# Patient Record
Sex: Female | Born: 1965 | Race: White | Hispanic: No | Marital: Single | Smoking: Former smoker
Health system: Southern US, Community
[De-identification: ages and names within clinical notes are randomized; demographics above are authoritative.]

## PROBLEM LIST (undated history)

## (undated) DIAGNOSIS — Z720 Tobacco use: Secondary | ICD-10-CM

## (undated) DIAGNOSIS — R911 Solitary pulmonary nodule: Secondary | ICD-10-CM

## (undated) DIAGNOSIS — C801 Malignant (primary) neoplasm, unspecified: Secondary | ICD-10-CM

## (undated) DIAGNOSIS — I10 Essential (primary) hypertension: Secondary | ICD-10-CM

## (undated) DIAGNOSIS — E785 Hyperlipidemia, unspecified: Secondary | ICD-10-CM

## (undated) DIAGNOSIS — D3A090 Benign carcinoid tumor of the bronchus and lung: Secondary | ICD-10-CM

## (undated) DIAGNOSIS — K219 Gastro-esophageal reflux disease without esophagitis: Secondary | ICD-10-CM

## (undated) DIAGNOSIS — E039 Hypothyroidism, unspecified: Secondary | ICD-10-CM

## (undated) HISTORY — DX: Essential (primary) hypertension: I10

## (undated) HISTORY — DX: Tobacco use: Z72.0

## (undated) HISTORY — DX: Solitary pulmonary nodule: R91.1

## (undated) HISTORY — PX: BONE RESECTION: SHX897

## (undated) HISTORY — DX: Gastro-esophageal reflux disease without esophagitis: K21.9

## (undated) HISTORY — DX: Benign carcinoid tumor of the bronchus and lung: D3A.090

## (undated) HISTORY — DX: Hyperlipidemia, unspecified: E78.5

## (undated) HISTORY — PX: TUBAL LIGATION: SHX77

---

## 2009-05-20 ENCOUNTER — Ambulatory Visit (HOSPITAL_COMMUNITY): Admission: RE | Admit: 2009-05-20 | Discharge: 2009-05-20 | Payer: Self-pay | Admitting: Family Medicine

## 2009-05-27 ENCOUNTER — Ambulatory Visit (HOSPITAL_COMMUNITY): Admission: RE | Admit: 2009-05-27 | Discharge: 2009-05-27 | Payer: Self-pay | Admitting: Family Medicine

## 2009-06-15 ENCOUNTER — Ambulatory Visit: Payer: Self-pay | Admitting: Thoracic Surgery

## 2009-06-28 ENCOUNTER — Encounter: Payer: Self-pay | Admitting: Thoracic Surgery

## 2009-06-28 ENCOUNTER — Inpatient Hospital Stay (HOSPITAL_COMMUNITY): Admission: RE | Admit: 2009-06-28 | Discharge: 2009-07-03 | Payer: Self-pay | Admitting: Thoracic Surgery

## 2009-06-28 ENCOUNTER — Ambulatory Visit: Payer: Self-pay | Admitting: Thoracic Surgery

## 2009-06-28 HISTORY — PX: OTHER SURGICAL HISTORY: SHX169

## 2009-07-09 ENCOUNTER — Ambulatory Visit: Payer: Self-pay | Admitting: Thoracic Surgery

## 2009-07-09 ENCOUNTER — Encounter: Admission: RE | Admit: 2009-07-09 | Discharge: 2009-07-09 | Payer: Self-pay | Admitting: Thoracic Surgery

## 2009-07-23 ENCOUNTER — Encounter: Admission: RE | Admit: 2009-07-23 | Discharge: 2009-07-23 | Payer: Self-pay | Admitting: Thoracic Surgery

## 2009-07-23 ENCOUNTER — Ambulatory Visit: Payer: Self-pay | Admitting: Thoracic Surgery

## 2009-09-03 ENCOUNTER — Ambulatory Visit: Payer: Self-pay | Admitting: Thoracic Surgery

## 2009-09-03 ENCOUNTER — Encounter: Admission: RE | Admit: 2009-09-03 | Discharge: 2009-09-03 | Payer: Self-pay | Admitting: Thoracic Surgery

## 2009-11-09 ENCOUNTER — Ambulatory Visit: Payer: Self-pay | Admitting: Thoracic Surgery

## 2009-11-09 ENCOUNTER — Encounter: Admission: RE | Admit: 2009-11-09 | Discharge: 2009-11-09 | Payer: Self-pay | Admitting: Thoracic Surgery

## 2010-05-25 ENCOUNTER — Encounter
Admission: RE | Admit: 2010-05-25 | Discharge: 2010-05-25 | Payer: Self-pay | Source: Home / Self Care | Attending: Thoracic Surgery | Admitting: Thoracic Surgery

## 2010-05-25 ENCOUNTER — Ambulatory Visit
Admission: RE | Admit: 2010-05-25 | Discharge: 2010-05-25 | Payer: Self-pay | Source: Home / Self Care | Attending: Thoracic Surgery | Admitting: Thoracic Surgery

## 2010-05-29 ENCOUNTER — Encounter: Payer: Self-pay | Admitting: Thoracic Surgery

## 2010-05-29 ENCOUNTER — Encounter: Payer: Self-pay | Admitting: Family Medicine

## 2010-07-24 LAB — PROTIME-INR
INR: 0.89 (ref 0.00–1.49)
Prothrombin Time: 12 seconds (ref 11.6–15.2)

## 2010-07-24 LAB — CBC
MCHC: 33.3 g/dL (ref 30.0–36.0)
Platelets: 384 10*3/uL (ref 150–400)
RBC: 4.65 MIL/uL (ref 3.87–5.11)
WBC: 15 10*3/uL — ABNORMAL HIGH (ref 4.0–10.5)

## 2010-07-25 LAB — GLUCOSE, CAPILLARY: Glucose-Capillary: 129 mg/dL — ABNORMAL HIGH (ref 70–99)

## 2010-07-28 LAB — TYPE AND SCREEN
ABO/RH(D): O POS
Antibody Screen: NEGATIVE

## 2010-07-28 LAB — URINE CULTURE: Colony Count: NO GROWTH

## 2010-07-28 LAB — URINALYSIS, ROUTINE W REFLEX MICROSCOPIC
Glucose, UA: >1000 mg/dL — AB
Hgb urine dipstick: NEGATIVE
Ketones, ur: NEGATIVE mg/dL
Protein, ur: NEGATIVE mg/dL
pH: 6 (ref 5.0–8.0)

## 2010-07-28 LAB — GLUCOSE, CAPILLARY
Glucose-Capillary: 105 mg/dL — ABNORMAL HIGH (ref 70–99)
Glucose-Capillary: 112 mg/dL — ABNORMAL HIGH (ref 70–99)
Glucose-Capillary: 112 mg/dL — ABNORMAL HIGH (ref 70–99)
Glucose-Capillary: 122 mg/dL — ABNORMAL HIGH (ref 70–99)
Glucose-Capillary: 124 mg/dL — ABNORMAL HIGH (ref 70–99)
Glucose-Capillary: 139 mg/dL — ABNORMAL HIGH (ref 70–99)
Glucose-Capillary: 140 mg/dL — ABNORMAL HIGH (ref 70–99)
Glucose-Capillary: 163 mg/dL — ABNORMAL HIGH (ref 70–99)
Glucose-Capillary: 187 mg/dL — ABNORMAL HIGH (ref 70–99)
Glucose-Capillary: 223 mg/dL — ABNORMAL HIGH (ref 70–99)
Glucose-Capillary: 77 mg/dL (ref 70–99)
Glucose-Capillary: 88 mg/dL (ref 70–99)

## 2010-07-28 LAB — CBC
HCT: 38.3 % (ref 36.0–46.0)
HCT: 41 % (ref 36.0–46.0)
HCT: 41 % (ref 36.0–46.0)
HCT: 47.2 % — ABNORMAL HIGH (ref 36.0–46.0)
Hemoglobin: 12.8 g/dL (ref 12.0–15.0)
MCHC: 33.2 g/dL (ref 30.0–36.0)
MCHC: 33.3 g/dL (ref 30.0–36.0)
MCHC: 33.9 g/dL (ref 30.0–36.0)
MCV: 96.8 fL (ref 78.0–100.0)
MCV: 97.6 fL (ref 78.0–100.0)
MCV: 97.9 fL (ref 78.0–100.0)
MCV: 98.5 fL (ref 78.0–100.0)
Platelets: 299 10*3/uL (ref 150–400)
Platelets: 316 10*3/uL (ref 150–400)
Platelets: 320 10*3/uL (ref 150–400)
Platelets: 381 10*3/uL (ref 150–400)
RDW: 16.4 % — ABNORMAL HIGH (ref 11.5–15.5)
RDW: 16.7 % — ABNORMAL HIGH (ref 11.5–15.5)
RDW: 16.8 % — ABNORMAL HIGH (ref 11.5–15.5)
RDW: 17 % — ABNORMAL HIGH (ref 11.5–15.5)
WBC: 14.5 10*3/uL — ABNORMAL HIGH (ref 4.0–10.5)
WBC: 20.3 10*3/uL — ABNORMAL HIGH (ref 4.0–10.5)

## 2010-07-28 LAB — COMPREHENSIVE METABOLIC PANEL
ALT: 40 U/L — ABNORMAL HIGH (ref 0–35)
AST: 22 U/L (ref 0–37)
AST: 32 U/L (ref 0–37)
Albumin: 2.5 g/dL — ABNORMAL LOW (ref 3.5–5.2)
Alkaline Phosphatase: 70 U/L (ref 39–117)
CO2: 27 mEq/L (ref 19–32)
Calcium: 8.2 mg/dL — ABNORMAL LOW (ref 8.4–10.5)
Creatinine, Ser: 0.81 mg/dL (ref 0.4–1.2)
GFR calc Af Amer: >60 mL/min (ref >60)
GFR calc non Af Amer: >60 mL/min (ref >60)
GFR calc non Af Amer: >60 mL/min (ref >60)
Glucose, Bld: 57 mg/dL — ABNORMAL LOW (ref 70–99)
Potassium: 3.7 mEq/L (ref 3.5–5.1)
Sodium: 139 mEq/L (ref 135–145)
Total Protein: 5.5 g/dL — ABNORMAL LOW (ref 6.0–8.3)

## 2010-07-28 LAB — POCT I-STAT 3, ART BLOOD GAS (G3+)
Acid-Base Excess: 4 mmol/L — ABNORMAL HIGH (ref 0.0–2.0)
Acid-Base Excess: 5 mmol/L — ABNORMAL HIGH (ref 0.0–2.0)
Bicarbonate: 24.7 mEq/L — ABNORMAL HIGH (ref 20.0–24.0)
Bicarbonate: 32 mEq/L — ABNORMAL HIGH (ref 20.0–24.0)
O2 Saturation: 100 %
O2 Saturation: 95 %
Patient temperature: 98.8
Patient temperature: 99.2
TCO2: 25 mmol/L (ref 0–100)
pCO2 arterial: 22.8 mmHg — ABNORMAL LOW (ref 35.0–45.0)
pH, Arterial: 7.642 (ref 7.350–7.400)
pO2, Arterial: 145 mmHg — ABNORMAL HIGH (ref 80.0–100.0)
pO2, Arterial: 84 mmHg (ref 80.0–100.0)

## 2010-07-28 LAB — PROTIME-INR: Prothrombin Time: 11.3 seconds — ABNORMAL LOW (ref 11.6–15.2)

## 2010-07-28 LAB — BASIC METABOLIC PANEL
BUN: 15 mg/dL (ref 6–23)
CO2: 33 mEq/L — ABNORMAL HIGH (ref 19–32)
Chloride: 101 mEq/L (ref 96–112)
Chloride: 92 mEq/L — ABNORMAL LOW (ref 96–112)
Creatinine, Ser: 0.83 mg/dL (ref 0.4–1.2)
GFR calc non Af Amer: >60 mL/min (ref >60)
Glucose, Bld: 153 mg/dL — ABNORMAL HIGH (ref 70–99)
Glucose, Bld: 161 mg/dL — ABNORMAL HIGH (ref 70–99)
Potassium: 3.8 mEq/L (ref 3.5–5.1)
Potassium: 3.9 mEq/L (ref 3.5–5.1)
Sodium: 132 mEq/L — ABNORMAL LOW (ref 135–145)

## 2010-07-28 LAB — BLOOD GAS, ARTERIAL
Acid-Base Excess: 3.5 mmol/L — ABNORMAL HIGH (ref 0.0–2.0)
Bicarbonate: 27.9 mEq/L — ABNORMAL HIGH (ref 20.0–24.0)
FIO2: 0.21 %
O2 Saturation: 97.8 %
Patient temperature: 98.6

## 2010-07-28 LAB — ABO/RH: ABO/RH(D): O POS

## 2010-07-28 LAB — URINE MICROSCOPIC-ADD ON

## 2010-07-28 LAB — MRSA PCR SCREENING: MRSA by PCR: NEGATIVE

## 2010-09-20 NOTE — Letter (Signed)
July 09, 2009   Forest Gleason, MD  [address]  [address]  [place], [place]  [address]   Re:  DONICE, ALPERIN              DOB:  [birth year]   Dear Dr. 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  There is no evidence of any metastatic disease in  lymph nodes.  Her incisions are healing well.  We removed her chest tube  sutures.  I told her to gradually increase her activities.  She is still  having a moderate amount of pain.  We gave her another prescription for  Percocet, #50.  I plan to see her back again in 3 weeks with a chest x-  ray.   Ines Bloomer, M.D.  Electronically Signed   DPB/MEDQ  D:  07/09/2009  T:  07/10/2009  Job:  [id number]

## 2010-09-20 NOTE — Letter (Signed)
June 15, 2009   Forest Gleason, MD  [address]  [address]  [place], [place]  [address]   Re:  Joanne [name], Joanne [name]              DOB:  [birth year]   Dear Dr. Shelva Majestic:   I saw the patient back today.  This 45 year old former smoker has had a  history of some nausea and vomiting and severe diarrhea, which she lost  about 30 pounds.  This has since resolved; but in her workup, a CT scan  was done of the abdomen, which showed a right lower lobe nodule and then  a CT of the chest, which showed multiple nodules in the right lung with  the largest being a 14-mm nodule in the right lower lobe.  She has 3  nodules in the right lung.  A needle biopsy was told to be attempted,  but that could not be done.  She had a PET scan, which had an SUV on the  right lower lobe 14-mm nodule of 3.  The other nodules were not  enlarged.  She is referred here for evaluation.  She has had no  hemoptysis, fever, chills, or excessive sputum.   PAST MEDICAL HISTORY:   MEDICATIONS:  1. Hydrochlorothiazide 25 mg a day.  2. Lisinopril 10 mg a day.  3. Metformin 1000 mg daily for diabetes mellitus type 2.  4. Lovastatin 40 mg at night for dyslipidemia and she also has      hypertension.   ALLERGIES:  She is allergic to penicillin.   FAMILY HISTORY:  Noncontributory.   SOCIAL HISTORY:  She worked with radiology.  She has 2 children.  She is  married.  She quit smoking 14 weeks ago.  Does not drink alcohol on a  regular basis.   REVIEW OF SYSTEMS:  VITAL SIGNS:  Her weight is 202 pounds.  She is 5  feet 3.  GENERAL:  She has now gained back her weight that she has lost.  CARDIAC:  No angina or atrial fibrillation.  PULMONARY:  No bronchitis or hemoptysis.  GI:  No nausea, vomiting, constipation, or diarrhea.  See history of  present illness.  GU:  No kidney disease.  VASCULAR:  No claudication, DVT, or TIAs.  NEUROLOGICAL:  No dizziness, headaches, blackouts, or seizures.  MUSCULOSKELETAL:  No arthritis or  joint pain.  PSYCHIATRIC:  No depression or nervousness.  EYE/ENT:  No change in her eyesight or hearing.  HEMATOLOGICAL:  No problems with bleeding, clotting disorders, or  anemia.   PHYSICAL EXAMINATION:  General:  She is an obese Caucasian female in no  acute distress.  Her blood pressure was 146/78, pulse 96, respirations  18, and sats were 98%.  Head, Eyes, Ears, Nose, and Throat:  Unremarkable.  Neck:  Supple without thyromegaly.  There is no  supraclavicular or axillary adenopathy.  Chest:  Clear to auscultation  and percussion.  Heart:  Regular sinus rhythm.  No murmurs.  Abdomen:  Soft.  There is no hepatosplenomegaly.  Extremities:  Pulses are 2+.  There is no clubbing or edema.  Neurological:  She is oriented x3.  Sensory and motor intact.  Cranial nerves intact.   I have discussed the situation with her, with the right lower lobe  nodule being borderline and positive on PET, I feel the best thing to do  is to excise this since there is a high probability, this may be a low-  grade  neuroendocrine tumor or carcinoid tumor where at the same time we  will excise the right middle lobe nodule, which is the next largest and  remove the nodules that are palpated on the right side.  She agrees to  this.  We plan to do this on June 28, 2008, at [facility].   Sincerely,   Ines Bloomer, M.D.  Electronically Signed   DPB/MEDQ  D:  06/15/2009  T:  06/16/2009  Job:  [id number]

## 2010-09-20 NOTE — Assessment & Plan Note (Signed)
OFFICE VISIT   Joanne [name], Joanne [name]  DOB:  [birth year]                                        July 23, 2009  CHART #:  [id number]   HISTORY:  The patient comes in today for 2-week followup.  She is status  post right lower lobe wedge resection on June 28, 2009.  She had  been doing well from a postsurgical standpoint.  She is still having a  little bit of discomfort, but overall is doing well.  Over the past 48  hours, though she has had a reoccurrence of nausea and vomiting with  some diarrhea.  She had had this previously and currently is taking  Phenergan and some things to help with diarrhea.  Her symptoms have been  stable since early this morning and she has had no vomiting since  earlier in the day, but she generally feels weak and tired.  She denies  any fevers, chills, or cough.  She has not spoken with her primary care  physician about her symptoms.   PHYSICAL EXAMINATION:  Vital Signs:  Blood pressure is 123/87, pulse is  118, respirations 18, O2 sat 97% on room air, and temperature 98.  Chest:  Her right thoracotomy incision as well as chest tube sites have  healed well.  Heart:  Regular rate and rhythm without murmurs, rubs, or  gallops.  Lungs:  Clear on the left, slightly diminished on the right.   Chest x-ray is stable.   ASSESSMENT AND PLAN:  The patient is doing well from a postsurgical  standpoint, status post right lower lobe wedge resection on June 28, 2009.  Dr. Edwyna Shell saw her today and instructed her to follow up with her  primary care physician if her GI symptoms do not improve.  We will plan  to see her back in 6 weeks with a repeat chest x-ray.  Dr. Edwyna Shell also  gave her a  note today to return to work after Sep 05, 2009.  She may call in the  interim if she experiences any problems or has further questions.   Ines Bloomer, M.D.  Electronically Signed   GC/MEDQ  D:  07/23/2009  T:  07/24/2009  Job:  [id number]   cc:    Linard Millers, MD

## 2010-09-20 NOTE — Assessment & Plan Note (Signed)
OFFICE VISIT   Joanne [name], Joanne [name]  DOB:  [birth year]                                        September 03, 2009  CHART #:  [id number]   The patient came today.  Her incisions are well healed.  She is still  having some pains, but this is improving, having trouble sleeping.  We  plan to see her back again in 6 weeks with another chest x-ray.  Her  chest x-ray showed parenchymal changes in the left base, which is what  we saw from a wedge resection.   Ines Bloomer, M.D.  Electronically Signed   DPB/MEDQ  D:  09/03/2009  T:  09/04/2009  Job:  [id number]

## 2010-09-20 NOTE — Letter (Signed)
November 09, 2009   Forest Gleason, MD  [address]  [address]  [place], [place]  [address]   Re:  Joanne, [name]              DOB:  01-24-1966   Dear Dr. Shelva Majestic:   I saw the patient back, 6 months since her surgery, and her chest x-ray  is well healed.  She has no evidence of recurrence of her carcinoid  tumor.  She is doing well overall.  Her blood pressure is 131/86, she is  off blood pressure medication, pulse 74, respirations 16, and sats are  99%.  I will see her back again in 6 months with a CT scan, which will  be approximately 1 year.   Ines Bloomer, M.D.  Electronically Signed   DPB/MEDQ  D:  11/09/2009  T:  11/10/2009  Job:  [id number]

## 2010-09-20 NOTE — Letter (Signed)
May 25, 2010   Forest Gleason, MD  [address]  [place], [place]  [address]   Re:  [name], Joanne              DOB:  Feb 16, 1966   Dear Dr. Shelva Majestic:   I saw the patient back today.  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  CT scan showed no evidence  of recurrence.  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  Overall,  she is doing well.  Her blood pressure was 124/79, pulse 76,  respirations 20, and sats were 98%.  I plan to see her back again in 9  months.  She is having some pain around the chest tube site, but this  has been feeling a big scar there.   Sincerely,   Ines Bloomer, M.D.  Electronically Signed   DPB/MEDQ  D:  05/25/2010  T:  05/25/2010  Job:  [id number]

## 2010-10-04 ENCOUNTER — Ambulatory Visit (INDEPENDENT_AMBULATORY_CARE_PROVIDER_SITE_OTHER): Payer: Self-pay | Admitting: Thoracic Surgery

## 2010-10-04 DIAGNOSIS — C3490 Malignant neoplasm of unspecified part of unspecified bronchus or lung: Secondary | ICD-10-CM

## 2010-10-05 NOTE — Assessment & Plan Note (Signed)
OFFICE VISIT  Joanne Le, Joanne Le DOB:  31-Jan-1966                                        Oct 04, 2010 CHART #:  [id number]  HISTORY OF PRESENT ILLNESS:  [name] comes today and is complaining of problems with her chest tube site where she had a surgery 15 months ago.  She says it throbs and Aleve does help with palpation.  PHYSICAL EXAMINATION:  Lungs:  Clear to auscultation and percussion. Vital Signs:  Her blood pressure is 128/80, pulse 74, respirations 16, and sats were 97%.  IMPRESSION AND PLAN:  I did not palpate any palpable mass, just scar tissue at the chest tube site.  I reassured her and see her again if she has any further problems.  Ines Bloomer, M.D. Electronically Signed  DPB/MEDQ  D:  10/04/2010  T:  10/05/2010  Job:  [id number]

## 2010-11-23 ENCOUNTER — Ambulatory Visit (HOSPITAL_COMMUNITY)
Admission: RE | Admit: 2010-11-23 | Discharge: 2010-11-23 | Disposition: A | Discharge status: Home / Self Care | Payer: Self-pay | Source: Ambulatory Visit | Attending: Family Medicine | Admitting: Family Medicine

## 2010-11-23 DIAGNOSIS — R209 Unspecified disturbances of skin sensation: Secondary | ICD-10-CM | POA: Insufficient documentation

## 2010-11-23 DIAGNOSIS — M79609 Pain in unspecified limb: Secondary | ICD-10-CM

## 2010-11-23 DIAGNOSIS — E119 Type 2 diabetes mellitus without complications: Secondary | ICD-10-CM | POA: Insufficient documentation

## 2011-01-27 ENCOUNTER — Other Ambulatory Visit: Payer: Self-pay | Admitting: Thoracic Surgery

## 2011-01-27 DIAGNOSIS — D381 Neoplasm of uncertain behavior of trachea, bronchus and lung: Secondary | ICD-10-CM

## 2011-02-28 ENCOUNTER — Encounter: Payer: Self-pay | Admitting: Thoracic Surgery

## 2011-02-28 DIAGNOSIS — Z720 Tobacco use: Secondary | ICD-10-CM

## 2011-02-28 DIAGNOSIS — I10 Essential (primary) hypertension: Secondary | ICD-10-CM

## 2011-02-28 DIAGNOSIS — E785 Hyperlipidemia, unspecified: Secondary | ICD-10-CM

## 2011-02-28 DIAGNOSIS — R911 Solitary pulmonary nodule: Secondary | ICD-10-CM

## 2011-02-28 DIAGNOSIS — E119 Type 2 diabetes mellitus without complications: Secondary | ICD-10-CM | POA: Insufficient documentation

## 2011-03-02 ENCOUNTER — Ambulatory Visit: Payer: Self-pay | Admitting: Thoracic Surgery

## 2011-03-02 ENCOUNTER — Other Ambulatory Visit: Payer: Self-pay

## 2011-03-03 DIAGNOSIS — D3A090 Benign carcinoid tumor of the bronchus and lung: Secondary | ICD-10-CM | POA: Insufficient documentation

## 2011-03-06 ENCOUNTER — Ambulatory Visit
Admission: RE | Admit: 2011-03-06 | Discharge: 2011-03-06 | Disposition: A | Discharge status: Home / Self Care | Payer: No Typology Code available for payment source | Source: Ambulatory Visit | Attending: Thoracic Surgery | Admitting: Thoracic Surgery

## 2011-03-06 ENCOUNTER — Ambulatory Visit (INDEPENDENT_AMBULATORY_CARE_PROVIDER_SITE_OTHER): Payer: Self-pay | Admitting: Thoracic Surgery

## 2011-03-06 ENCOUNTER — Encounter: Payer: Self-pay | Admitting: Thoracic Surgery

## 2011-03-06 VITALS — BP 141/83 | HR 72 | Resp 20 | Ht 63.0 in | Wt 200.0 lb

## 2011-03-06 DIAGNOSIS — D381 Neoplasm of uncertain behavior of trachea, bronchus and lung: Secondary | ICD-10-CM

## 2011-03-06 DIAGNOSIS — D3A090 Benign carcinoid tumor of the bronchus and lung: Secondary | ICD-10-CM

## 2011-03-06 NOTE — Progress Notes (Signed)
HPI patient returns 21 months since resection of a carcinoid tumor. CT scan showed no evidence of recurrence. The thymus gland still is unchanged with a question of mild hyperplasia. Problems refer her back to her medical Dr. for long-term followup. She has had no pulmonary symptoms.  Current Outpatient Prescriptions  Medication Sig Dispense Refill  . fish oil-omega-3 fatty acids 1000 MG capsule Take 1 g by mouth daily.        . hydrochlorothiazide (HYDRODIURIL) 25 MG tablet Take 25 mg by mouth daily.        Marland Kitchen lisinopril (PRINIVIL,ZESTRIL) 5 MG tablet Take 2.5 mg by mouth daily.        Marland Kitchen lovastatin (MEVACOR) 40 MG tablet Take 40 mg by mouth at bedtime.        . metFORMIN (GLUCOPHAGE) 1000 MG tablet Take 1,000 mg by mouth 2 (two) times daily with a meal.           Review of Systems:unchanged   Physical Exam  Cardiovascular: Normal rate, regular rhythm and normal heart sounds.   Pulmonary/Chest: Effort normal and breath sounds normal. No respiratory distress.     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

## 2011-08-15 IMAGING — CT NM PET TUM IMG INITIAL (PI) SKULL BASE T - THIGH
6 series · 25 of 25 positions shown · IV contrast ([ID])
Comparison: None.

Addendum Begins

At the request of Dr. Brendalyz, the PET scan was reviewed and compared
to a prior CT of the chest performed on 04/29/2010 at [facility]
[HOSPITAL].
CT imaging performed at the time of PET CT demonstrates stable size
and appearance of the dominant pulmonary nodule located in the
posterior and basilar aspect of the right lower lobe.  Dimensions
are estimated to be 1.1 x 1.4 cm.  This nodule does not demonstrate
visible interval growth in the 1 month interval between the prior
CT and current PET.
Other smaller pulmonary nodules detected by prior CT in both lungs
do appear to be less prominent in size compared to prior CT.
However, resolution of the attenuation correction CT images but had
are not as high as the diagnostic CT images.
Options for further evaluation include percutaneous biopsy,
thoracic surgical consultation for possible resection and follow-up
CT in roughly 2 months.  I discussed this case directly with Dr.
Brendalyz on 05/28/09.
Addendum Ends
CLINICAL DATA: Initial treatment strategy for lung nodules seen on
outside CT.
NUCLEAR MEDICINE PET CT INITIAL (PI) SKULL BASE TO THIGH
TECHNIQUE: 15.5 mCi F-18 FDG was injected intravenously via the
right arm.  Full-ring PET imaging was performed from the skull base
through the mid-thighs 75  minutes after injection.  CT data was
obtained and used for attenuation correction and anatomic
localization only.  (This was not acquired as a diagnostic CT
examination.)
Fasting Blood Glucose:  129

[Series 1: pet ac · axial · 3.3mm · 4.69mm/px · z∈[-870,+0]mm · 5 of 267 slices shown]
[im 1/267]
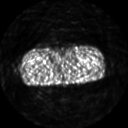
[im 67/267]
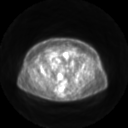
[im 134/267]
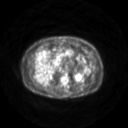
[im 200/267]
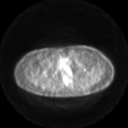
[im 267/267]
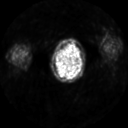

[Series 2: ct images · axial · 3.8mm · 0.98mm/px · z∈[-870,+0]mm · 5 of 267 slices shown]
[im 1/267]
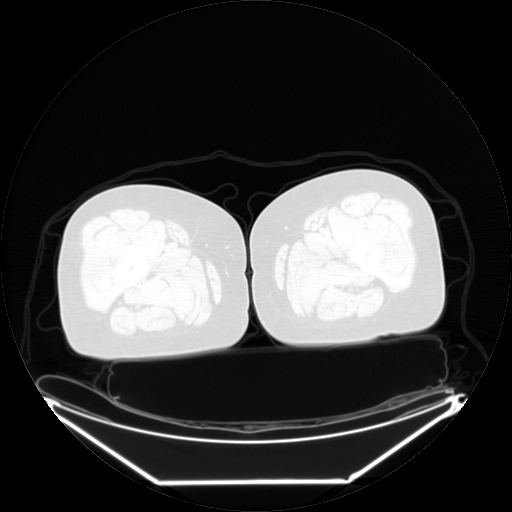
[im 67/267]
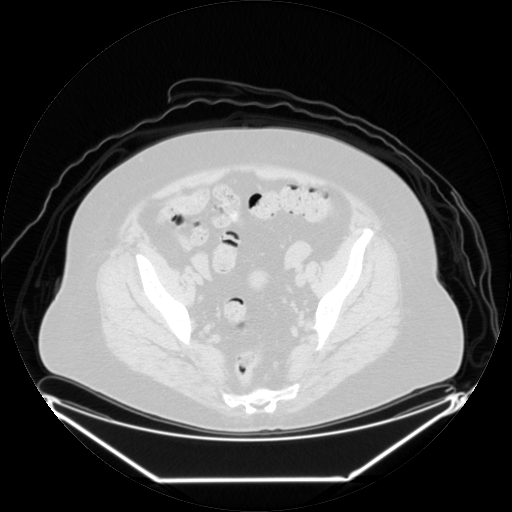
[im 134/267]
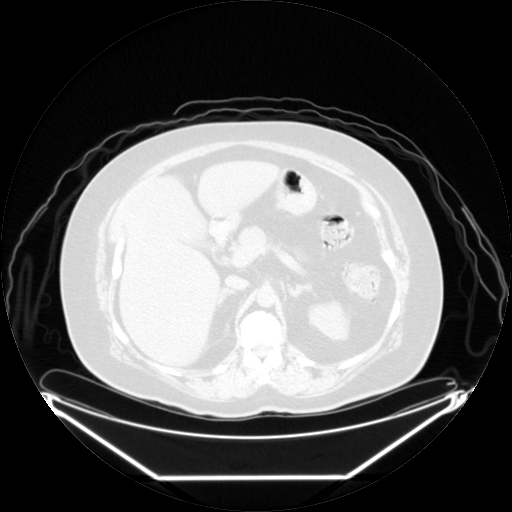
[im 200/267]
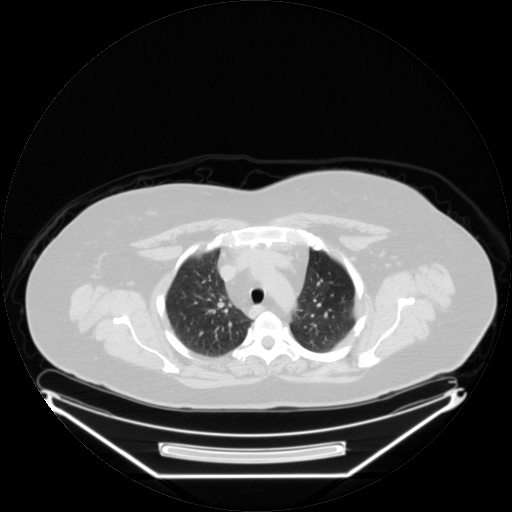
[im 267/267  brain]
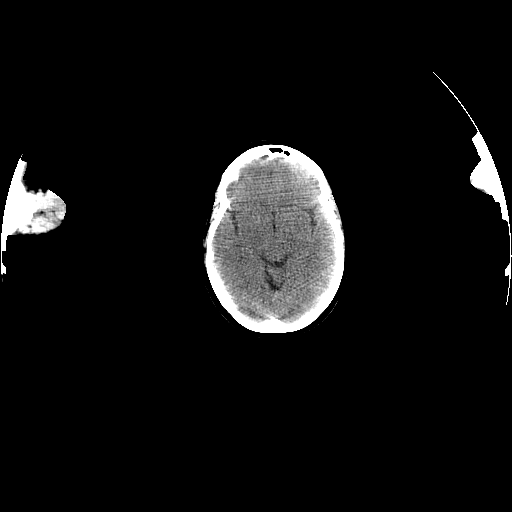

[Series 2: pet nac · axial · 3.3mm · 4.69mm/px · z∈[-870,+0]mm · 6 of 267 slices shown]
[im 1/267]
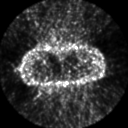
[im 54/267]
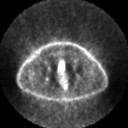
[im 107/267]
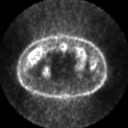
[im 160/267]
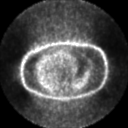
[im 213/267]
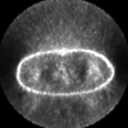
[im 267/267]
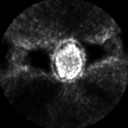

[Series 123: mip · coronal · 3.3mm · 4.69mm/px · 1 of 30 slices shown]
[im 1/30]
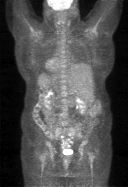

[Series 151: reformatted · axial · 3.3mm · 3.91mm/px · z∈[-870,+0]mm · 6 of 265 slices shown (1 of 2)]
[im 1/265]
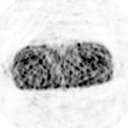
[im 53/265]
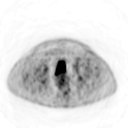
[im 106/265]
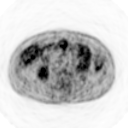
[im 159/265]
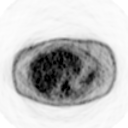
[im 212/265]
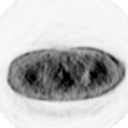
[im 265/265]
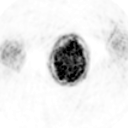

[Series 153: reformatted · coronal · 4.7mm · 6.98mm/px · 2 of 75 slices shown (2 of 2)]
[im 1/75]
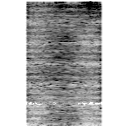
[im 75/75]
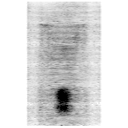

[25 of 25 positions shown; findings below may reference images not displayed]

FINDINGS: 12 mm noncalcified nodule in the posterior medial right
lower lobe shows mild hypermetabolic activity, with maximum SUV of
3.0.  Other smaller sub-centimeter pulmonary nodules show no
definite hypermetabolic activity, however these are below size
threshold for PET detection of malignancy

There is no evidence of hypermetabolic hilar or mediastinal lymph
nodes.  No hypermetabolic masses or adenopathy are seen elsewhere
within the chest, neck, abdomen, or pelvis.
IMPRESSION: 1.  Low grade hypermetabolic activity within a 12 mm posterior
right lower lobe pulmonary nodule.  Differential diagnosis includes
carcinoma and inflammatory etiologies.  If tissue diagnosis is not
obtained, recommend short term follow-up by chest CT in 3 months.
2.  Other tiny indeterminate pulmonary nodules are below size
threshold for PET detection of malignancy.
3.  No other hypermetabolic masses or adenopathy identified.

## 2013-03-11 ENCOUNTER — Institutional Professional Consult (permissible substitution) (INDEPENDENT_AMBULATORY_CARE_PROVIDER_SITE_OTHER): Payer: BC Managed Care – PPO | Admitting: Thoracic Surgery (Cardiothoracic Vascular Surgery)

## 2013-03-11 ENCOUNTER — Encounter: Payer: Self-pay | Admitting: Thoracic Surgery (Cardiothoracic Vascular Surgery)

## 2013-03-11 ENCOUNTER — Other Ambulatory Visit: Payer: Self-pay | Admitting: *Deleted

## 2013-03-11 DIAGNOSIS — Z09 Encounter for follow-up examination after completed treatment for conditions other than malignant neoplasm: Secondary | ICD-10-CM

## 2013-03-11 DIAGNOSIS — R911 Solitary pulmonary nodule: Secondary | ICD-10-CM

## 2013-03-11 DIAGNOSIS — C3430 Malignant neoplasm of lower lobe, unspecified bronchus or lung: Secondary | ICD-10-CM

## 2013-03-11 DIAGNOSIS — M546 Pain in thoracic spine: Secondary | ICD-10-CM

## 2013-03-11 NOTE — Progress Notes (Signed)
PCP is Calla Kicks, MD Referring Provider is Calla Kicks, MD  Chief Complaint  Patient presents with  . Routine Post Op    c/o back pain s/p RLLobe WEDGE RESECTION 06/28/09 by Dr. Edwyna Shell.Marland KitchenMarland KitchenCARCINOID...referred by Dr. Calla Kicks  . Back Pain    back pain has been for 6 months on the right side radiating to the front and she is unable to sleep on her right side    HPI: [name] complaints of back and right-sided chest pain over the past 6 months. She is a 47 year old woman with a history of a carcinoid tumor the right lower lobe. Dr. Edwyna Shell did a wedge resection of the right lower lobe through a small thoracotomy in 2011. Her last visit with him was in October of 2012. At that time her CT showed some scarring and also a few other small lung nodules. She says that she was having pain from her incisions at that time still. Since then that has waxed and waned. Says that over the past 6 months it's become more frequent and severe. She's having trouble lying on her right side. It is interfering with her sleep at night. She is concerned that it could be a sign that there is something in her chest. She has been taking ibuprofen for the pain 3 times a day for the past 6 months. She has tried Aleve but it did not improve her symptoms.  She says she has a wheezing occasionally can sometimes feels short of breath both at rest and with exertion. She also complains of loss of appetite and decreased energy. She is a former smoker but quit in 2010.   Past Medical History  Diagnosis Date  . Tobacco abuse   . Nodule of right lung     lower lobe  . Diabetes mellitus     type 2  . HTN (hypertension)   . Dyslipidemia   . 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 GERD (gastroesophageal reflux disease)     Past Surgical History  Procedure Laterality Date  . Right vats, mini thoracotomy, wedge right lower  06/28/2009  . Cesarean section    . Tubal ligation    . Bone resection       Family History  Problem Relation Age of Onset  . Diabetes Father   . Hypertension Father   . Hyperlipidemia Father   . Stroke Father   . Heart attack Father   . Osteoarthritis Mother   . Diabetes Paternal Grandfather   . Hyperlipidemia Paternal Grandfather   . Hypertension Paternal Grandfather   . Hypertension Paternal Grandmother   . Hyperlipidemia Paternal Grandmother   . Stroke Paternal Grandmother     Social History History  Substance Use Topics  . Smoking status: Former Smoker    Quit date: 03/25/2009  . Smokeless tobacco: Not on file  . Alcohol Use: Yes     Comment: not on a reg. basis    Current Outpatient Prescriptions  Medication Sig Dispense Refill  . ibuprofen (ADVIL,MOTRIN) 200 MG tablet Take 200 mg by mouth every 8 (eight) hours as needed.       No current facility-administered medications for this visit.    Allergies  Allergen Reactions  . Penicillins     Review of Systems  Constitutional: Positive for appetite change and fatigue. Negative for fever and unexpected weight change.  Respiratory: Positive for shortness of breath (At rest and with exertion) and wheezing ("a little").  Gastrointestinal:       Heart burn  Musculoskeletal:       Leg cramps  All other systems reviewed and are negative.    BP 183/94  Pulse 82  Resp 16  Ht 5\' 3"  (1.6 m)  Wt 200 lb (90.719 kg)  BMI 35.44 kg/m2  SpO2 98% Physical Exam  Vitals reviewed. Constitutional: She is oriented to person, place, and time. She appears well-developed. No distress.  Obese  HENT:  Head: Normocephalic and atraumatic.  Eyes: EOM are normal. Pupils are equal, round, and reactive to light.  Neck: Neck supple. No thyromegaly present.  Cardiovascular: Normal rate, regular rhythm, normal heart sounds and intact distal pulses.  Exam reveals no gallop and no friction rub.   No murmur heard. Pulmonary/Chest: Effort normal and breath sounds normal. No respiratory distress. She has no  wheezes.  Well-healed incision scars, tender to palpation  Abdominal: Soft. There is no tenderness.  Musculoskeletal: She exhibits no edema.  Lymphadenopathy:    She has no cervical adenopathy.  Neurological: She is alert and oriented to person, place, and time. No cranial nerve deficit.  Skin: Skin is warm and dry.     Diagnostic Tests: Chest x-ray 03/11/2013 Impression Scarring with loss on the right. No edema or consolidation. Small nodular opacity seen on prior CT or not appreciable on this current examination.  Impression: 47 year old woman who had a carcinoid tumor resected via a small thoracotomy back in 2011. She has been having a lot of pain in the right side of her back and chest radiating to the front. This is a dermatomal type distribution. 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 She has been taking Advil which helps a little but does not resolve pain.  There several options for pain management including narcotics, Neurontin, and referral to the pain clinic. I will defer to Dr.Cabezudo's judgment as to whether any of those are necessary. My impression is that she really just wanted to make sure there is not something serious going on internally. Her last CT was 03/06/2011 showed some small nodules. I do think it is reasonable to do a followup CT to make sure that those are stable.  Plan: CT of chest and followup in the office in 2 weeks

## 2013-03-21 ENCOUNTER — Ambulatory Visit (HOSPITAL_COMMUNITY)
Admission: RE | Admit: 2013-03-21 | Discharge: 2013-03-21 | Disposition: A | Discharge status: Home / Self Care | Payer: BC Managed Care – PPO | Source: Ambulatory Visit | Attending: Thoracic Surgery (Cardiothoracic Vascular Surgery) | Admitting: Thoracic Surgery (Cardiothoracic Vascular Surgery)

## 2013-03-21 ENCOUNTER — Encounter (HOSPITAL_COMMUNITY): Payer: Self-pay

## 2013-03-21 ENCOUNTER — Ambulatory Visit: Payer: Self-pay | Admitting: Thoracic Surgery (Cardiothoracic Vascular Surgery)

## 2013-03-21 DIAGNOSIS — R911 Solitary pulmonary nodule: Secondary | ICD-10-CM

## 2013-03-21 DIAGNOSIS — R918 Other nonspecific abnormal finding of lung field: Secondary | ICD-10-CM | POA: Insufficient documentation

## 2013-03-21 DIAGNOSIS — Z859 Personal history of malignant neoplasm, unspecified: Secondary | ICD-10-CM | POA: Insufficient documentation

## 2013-03-25 ENCOUNTER — Ambulatory Visit: Payer: Self-pay | Admitting: Thoracic Surgery (Cardiothoracic Vascular Surgery)

## 2013-04-01 ENCOUNTER — Ambulatory Visit (INDEPENDENT_AMBULATORY_CARE_PROVIDER_SITE_OTHER): Payer: BC Managed Care – PPO | Admitting: Thoracic Surgery (Cardiothoracic Vascular Surgery)

## 2013-04-01 ENCOUNTER — Encounter: Payer: Self-pay | Admitting: Thoracic Surgery (Cardiothoracic Vascular Surgery)

## 2013-04-01 VITALS — BP 180/93 | HR 74 | Resp 20 | Ht 63.0 in | Wt 200.0 lb

## 2013-04-01 DIAGNOSIS — Z87898 Personal history of other specified conditions: Secondary | ICD-10-CM

## 2013-04-01 DIAGNOSIS — Z86012 Personal history of benign carcinoid tumor: Secondary | ICD-10-CM

## 2013-04-01 DIAGNOSIS — R911 Solitary pulmonary nodule: Secondary | ICD-10-CM

## 2013-04-01 NOTE — Progress Notes (Signed)
  HPI:  Joanne Le returns to discuss the results of her CT scan done on November 14. She had a thoracoscopic wedge resection by Dr. Edwyna Shell back in February of 2011 for a 14 mm lung nodule that turned out to be a carcinoid tumor. She had multiple other lung nodules at that time. She was followed for those 2 2012 and they remain stable. Recently she's been having increasing pain on the right side was concerned that it could be a sign of recurrence. We did a repeat CT to rule out that possibility.  Past Medical History  Diagnosis Date  . Tobacco abuse   . Nodule of right lung     lower lobe  . Diabetes mellitus     type 2  . HTN (hypertension)   . Dyslipidemia   . 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 GERD (gastroesophageal reflux disease)       Current Outpatient Prescriptions  Medication Sig Dispense Refill  . ibuprofen (ADVIL,MOTRIN) 200 MG tablet Take 200 mg by mouth every 8 (eight) hours as needed.       No current facility-administered medications for this visit.    Physical Exam BP 180/93  Pulse 74  Resp 20  Ht 5\' 3"  (1.6 m)  Wt 200 lb (90.719 kg)  BMI 35.44 kg/m2  SpO2 98%  LMP 02/20/2011 47 year old woman in no acute distress  Diagnostic Tests: CT chest 03/21/2013 CT CHEST WITHOUT CONTRAST  TECHNIQUE:  Multidetector CT imaging of the chest was performed following the  standard protocol without IV contrast.  COMPARISON: 02/24/2011  FINDINGS:  5 mm right middle lobe pulmonary nodule image 30 and other  previously seen pulmonary nodules are stable. Evidence of partial  right lower lobectomy. Probable intrapulmonary lymph node measuring  5 mm in short axis diameter image 7 is stable. Central airways are  patent. No new pulmonary nodule, mass, or consolidation.  No acute osseous abnormality. Thoracic spine disc degenerative  change is noted. Heart size is normal. No lymphadenopathy. No acute  osseous abnormality.  IMPRESSION:  Stable pulmonary  nodules.  Electronically Signed  By: Christiana Pellant M.D.  On: 03/21/2013 14:44  Impression: 47 year old woman who is now about 3-1/2 years out from a thoracoscopic wedge resection for carcinoid tumor. She has no evidence recurrent disease. She does have multiple other nodules which are stable on CT scan. There was no chest wall or thoracic spine abnormality to explain her pain.   Plan: Return in one year with repeat CT for a final followup.

## 2014-01-13 ENCOUNTER — Other Ambulatory Visit (HOSPITAL_COMMUNITY): Payer: Self-pay | Admitting: Family Medicine

## 2014-01-13 DIAGNOSIS — Z139 Encounter for screening, unspecified: Secondary | ICD-10-CM

## 2014-01-26 ENCOUNTER — Ambulatory Visit (HOSPITAL_COMMUNITY): Payer: BC Managed Care – PPO

## 2014-02-02 ENCOUNTER — Ambulatory Visit (HOSPITAL_COMMUNITY)
Admission: RE | Admit: 2014-02-02 | Discharge: 2014-02-02 | Disposition: A | Discharge status: Home / Self Care | Payer: BC Managed Care – PPO | Source: Ambulatory Visit | Attending: Family Medicine | Admitting: Family Medicine

## 2014-02-02 DIAGNOSIS — Z1231 Encounter for screening mammogram for malignant neoplasm of breast: Secondary | ICD-10-CM | POA: Insufficient documentation

## 2014-02-02 DIAGNOSIS — Z139 Encounter for screening, unspecified: Secondary | ICD-10-CM

## 2014-02-24 ENCOUNTER — Other Ambulatory Visit: Payer: Self-pay | Admitting: *Deleted

## 2014-02-24 DIAGNOSIS — R918 Other nonspecific abnormal finding of lung field: Secondary | ICD-10-CM

## 2014-03-31 ENCOUNTER — Ambulatory Visit: Payer: BC Managed Care – PPO | Admitting: Thoracic Surgery (Cardiothoracic Vascular Surgery)

## 2014-03-31 ENCOUNTER — Other Ambulatory Visit: Payer: BC Managed Care – PPO

## 2014-04-21 ENCOUNTER — Ambulatory Visit (INDEPENDENT_AMBULATORY_CARE_PROVIDER_SITE_OTHER): Payer: BC Managed Care – PPO | Admitting: Thoracic Surgery (Cardiothoracic Vascular Surgery)

## 2014-04-21 ENCOUNTER — Encounter: Payer: Self-pay | Admitting: Thoracic Surgery (Cardiothoracic Vascular Surgery)

## 2014-04-21 ENCOUNTER — Other Ambulatory Visit: Payer: BC Managed Care – PPO

## 2014-04-21 ENCOUNTER — Ambulatory Visit
Admission: RE | Admit: 2014-04-21 | Discharge: 2014-04-21 | Disposition: A | Discharge status: Home / Self Care | Payer: BC Managed Care – PPO | Source: Ambulatory Visit | Attending: Thoracic Surgery (Cardiothoracic Vascular Surgery) | Admitting: Thoracic Surgery (Cardiothoracic Vascular Surgery)

## 2014-04-21 VITALS — BP 186/89 | HR 76 | Resp 20 | Ht 63.0 in | Wt 200.0 lb

## 2014-04-21 DIAGNOSIS — R918 Other nonspecific abnormal finding of lung field: Secondary | ICD-10-CM

## 2014-04-21 DIAGNOSIS — Z86012 Personal history of benign carcinoid tumor: Secondary | ICD-10-CM

## 2014-04-21 DIAGNOSIS — R911 Solitary pulmonary nodule: Secondary | ICD-10-CM

## 2014-04-21 NOTE — Progress Notes (Signed)
HPI:  [name] returns for a one year follow up regarding multiple small lung nodules.  She had a thoracoscopic wedge resection by Dr. 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 She had multiple other lung nodules at that time. She was followed for those through 2012 and they remained stable.   She came back last year with a complaint of right sided chest pain and we repeated the scan. There was no change in the nodules.   She says that she has been under a lot of stress recently. Her father is in intensive care. She was recently diagnosed with hypertension and was started on lisinopril about a month ago. She did not take her lisinopril today. She denies chest pain or shortness of breath.  Past Medical History  Diagnosis Date  . Tobacco abuse   . Nodule of right lung     lower lobe  . Diabetes mellitus     type 2  . HTN (hypertension)   . Dyslipidemia   . 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 GERD (gastroesophageal reflux disease)       Current Outpatient Prescriptions  Medication Sig Dispense Refill  . ibuprofen (ADVIL,MOTRIN) 200 MG tablet Take 200 mg by mouth every 8 (eight) hours as needed.    Marland Kitchen lisinopril (PRINIVIL,ZESTRIL) 20 MG tablet Take 20 mg by mouth daily.     No current facility-administered medications for this visit.    Physical Exam BP 199/90 mmHg  Pulse 72  Resp 20  Ht 5\' 3"  (1.6 m)  Wt 200 lb (90.719 kg)  BMI 35.44 kg/m2  SpO2 98%  LMP 02/20/2011 48 yo woman in NAD Alert and oriented Lungs clear Cardiac RRR, normal S1 and S2   Diagnostic Tests: CLINICAL DATA: Followup of multiple pulmonary nodules. History of right lung cancer with surgery. Cough. Ex-smoker. ICD10: R 91.8.  EXAM: CT CHEST WITHOUT CONTRAST  TECHNIQUE: Multidetector CT imaging of the chest was performed following the standard protocol without IV contrast.  COMPARISON:  03/21/2013  FINDINGS: Lungs/Pleura: Surgical changes of right lower lobe wedge resection.  Perifissural 5 mm right middle lobe lung nodule on image 34 of series 4 is unchanged. Tandem nodules measuring maximally 5 mm in the right middle lobe on image 37 are similar.  Other bilateral pulmonary nodules are also not significantly changed. Example at 4 mm in the left upper lobe on image 29 of series 4 and 4 mm within the left lower lobe on image 35 of series 4.  No pleural fluid. There is mild right-sided pleural thickening adjacent to surgical changes.  Heart/Mediastinum: No supraclavicular adenopathy. Mild cardiomegaly, without pericardial effusion. No mediastinal or definite hilar adenopathy, given limitations of unenhanced CT.  Upper Abdomen: Normal imaged portions of the liver, spleen, stomach, pancreas, gallbladder, adrenal glands.  Bones/Musculoskeletal: No acute osseous abnormality.  IMPRESSION: 1. Status post right lower lobe wedge resection, without locally recurrent or metastatic disease. 2. Similar scattered bilateral pulmonary nodules. Ongoing stability suggests a benign etiology. Favor subpleural lymph nodes.   Electronically Signed  By: Joanne Le M.D.  On: 04/21/2014 16:15  Impression: 48 year old woman with a history of a carcinoid resection by Dr. Arlyce Dice about 5 years ago. She has had multiple small bilateral pulmonary nodules. These have remained stable over several years. There is no need for additional follow-up of those nodules.  Of greater concern today is her blood  pressure which was markedly elevated at 199/90. She did not take her lisinopril this morning prior to the scan. She's been under a lot of stress because of her father's illness. We are going to keep her in the office and recheck her blood pressure. If it remains in the 200 range she may need to go to the emergency room.  Plan: I will be happy to see her back if I can be of any  further assistance with her care in the future

## 2014-04-28 ENCOUNTER — Other Ambulatory Visit: Payer: BC Managed Care – PPO

## 2014-04-28 ENCOUNTER — Ambulatory Visit: Payer: BC Managed Care – PPO | Admitting: Thoracic Surgery (Cardiothoracic Vascular Surgery)

## 2015-09-02 ENCOUNTER — Ambulatory Visit (HOSPITAL_COMMUNITY)
Admission: RE | Admit: 2015-09-02 | Discharge: 2015-09-02 | Disposition: A | Discharge status: Home / Self Care | Payer: BLUE CROSS/BLUE SHIELD | Source: Ambulatory Visit | Attending: Family Medicine | Admitting: Family Medicine

## 2015-09-02 ENCOUNTER — Other Ambulatory Visit (HOSPITAL_COMMUNITY): Payer: Self-pay | Admitting: Family Medicine

## 2015-09-02 DIAGNOSIS — R0600 Dyspnea, unspecified: Secondary | ICD-10-CM | POA: Diagnosis not present

## 2015-09-15 ENCOUNTER — Other Ambulatory Visit: Payer: Self-pay | Admitting: Family Medicine

## 2015-09-15 DIAGNOSIS — Z1231 Encounter for screening mammogram for malignant neoplasm of breast: Secondary | ICD-10-CM

## 2015-09-24 ENCOUNTER — Ambulatory Visit (HOSPITAL_COMMUNITY): Payer: BLUE CROSS/BLUE SHIELD

## 2015-09-24 ENCOUNTER — Ambulatory Visit (HOSPITAL_COMMUNITY)
Admission: RE | Admit: 2015-09-24 | Discharge: 2015-09-24 | Disposition: A | Discharge status: Home / Self Care | Payer: BLUE CROSS/BLUE SHIELD | Source: Ambulatory Visit | Attending: Family Medicine | Admitting: Family Medicine

## 2015-09-24 DIAGNOSIS — Z1231 Encounter for screening mammogram for malignant neoplasm of breast: Secondary | ICD-10-CM | POA: Diagnosis not present

## 2015-10-18 ENCOUNTER — Telehealth: Payer: Self-pay

## 2015-10-18 NOTE — Telephone Encounter (Signed)
Gastroenterology Pre-Procedure Review  Request Date: Requesting Physician:   PATIENT REVIEW QUESTIONS: The patient responded to the following health history questions as indicated:    1. Diabetes Melitis: NO 2. Joint replacements in the past 12 months:NO 3. Major health problems in the past 3 months: NO 4. Has an artificial valve or MVP: NO 5. Has a defibrillator: NO 6. Has been advised in past to take antibiotics in advance of a procedure like teeth cleaning: NO 7. Family history of colon cancer: NO 8. Alcohol Use: NO 9. History of sleep apnea: NO    MEDICATIONS & ALLERGIES:    Patient reports the following regarding taking any blood thinners:   Plavix? NO Aspirin? YES Coumadin? NO  Patient confirms/reports the following medications:  Current Outpatient Prescriptions  Medication Sig Dispense Refill  . aspirin 81 MG tablet Take 81 mg by mouth daily.    Marland Kitchen ibuprofen (ADVIL,MOTRIN) 200 MG tablet Take 200 mg by mouth every 8 (eight) hours as needed.    Marland Kitchen levothyroxine (SYNTHROID, LEVOTHROID) 25 MCG tablet Take 25 mcg by mouth daily before breakfast.    . lisinopril-hydrochlorothiazide (PRINZIDE,ZESTORETIC) 20-25 MG tablet Take 1 tablet by mouth daily.     No current facility-administered medications for this visit.    Patient confirms/reports the following allergies:  Allergies  Allergen Reactions  . Penicillins     No orders of the defined types were placed in this encounter.    AUTHORIZATION INFORMATION Primary Insurance: [place],  [place] #: [id number],  Group #: [id number] Pre-Cert / Josem Kaufmann required:  Pre-Cert / Auth #:    SCHEDULE INFORMATION: Procedure has been scheduled as follows:  Date:  Time:  Location:   This Gastroenterology Pre-Precedure Review Form is being routed to the following provider(s):

## 2015-10-20 NOTE — Telephone Encounter (Signed)
Appropriate.

## 2015-10-25 ENCOUNTER — Other Ambulatory Visit: Payer: Self-pay

## 2015-10-25 DIAGNOSIS — Z1211 Encounter for screening for malignant neoplasm of colon: Secondary | ICD-10-CM

## 2015-10-25 MED ORDER — PEG 3350-KCL-NA BICARB-NACL 420 G PO SOLR: 4000.0000 mL | ORAL | Status: DC

## 2015-10-25 NOTE — Telephone Encounter (Signed)
Forwarding to Ginger who triaged pt.

## 2015-10-25 NOTE — Telephone Encounter (Signed)
ORDERS IN ANS INSTRUCTIONS ARE IN THE MAIL. SHE IS AWARE OF DATE(11/16/15) AND TIME(10:30)

## 2015-11-16 ENCOUNTER — Ambulatory Visit (HOSPITAL_COMMUNITY)
Admission: RE | Admit: 2015-11-16 | Discharge: 2015-11-16 | Disposition: A | Discharge status: Home / Self Care | Payer: BLUE CROSS/BLUE SHIELD | Source: Ambulatory Visit | Attending: Internal Medicine | Admitting: Internal Medicine

## 2015-11-16 ENCOUNTER — Encounter (HOSPITAL_COMMUNITY): Payer: Self-pay | Admitting: *Deleted

## 2015-11-16 ENCOUNTER — Encounter (HOSPITAL_COMMUNITY)
Admission: RE | Disposition: A | Discharge status: Home / Self Care | Payer: Self-pay | Source: Ambulatory Visit | Attending: Internal Medicine

## 2015-11-16 DIAGNOSIS — E785 Hyperlipidemia, unspecified: Secondary | ICD-10-CM | POA: Diagnosis not present

## 2015-11-16 DIAGNOSIS — E039 Hypothyroidism, unspecified: Secondary | ICD-10-CM | POA: Insufficient documentation

## 2015-11-16 DIAGNOSIS — Z8249 Family history of ischemic heart disease and other diseases of the circulatory system: Secondary | ICD-10-CM | POA: Insufficient documentation

## 2015-11-16 DIAGNOSIS — Z87891 Personal history of nicotine dependence: Secondary | ICD-10-CM | POA: Diagnosis not present

## 2015-11-16 DIAGNOSIS — E119 Type 2 diabetes mellitus without complications: Secondary | ICD-10-CM | POA: Diagnosis not present

## 2015-11-16 DIAGNOSIS — Z79899 Other long term (current) drug therapy: Secondary | ICD-10-CM | POA: Insufficient documentation

## 2015-11-16 DIAGNOSIS — Z7982 Long term (current) use of aspirin: Secondary | ICD-10-CM | POA: Diagnosis not present

## 2015-11-16 DIAGNOSIS — Z1211 Encounter for screening for malignant neoplasm of colon: Secondary | ICD-10-CM | POA: Diagnosis not present

## 2015-11-16 DIAGNOSIS — K5730 Diverticulosis of large intestine without perforation or abscess without bleeding: Secondary | ICD-10-CM | POA: Insufficient documentation

## 2015-11-16 DIAGNOSIS — Z880 Allergy status to penicillin: Secondary | ICD-10-CM | POA: Diagnosis not present

## 2015-11-16 DIAGNOSIS — I10 Essential (primary) hypertension: Secondary | ICD-10-CM | POA: Insufficient documentation

## 2015-11-16 DIAGNOSIS — Z823 Family history of stroke: Secondary | ICD-10-CM | POA: Diagnosis not present

## 2015-11-16 DIAGNOSIS — C3431 Malignant neoplasm of lower lobe, right bronchus or lung: Secondary | ICD-10-CM | POA: Diagnosis not present

## 2015-11-16 DIAGNOSIS — Z833 Family history of diabetes mellitus: Secondary | ICD-10-CM | POA: Diagnosis not present

## 2015-11-16 DIAGNOSIS — K219 Gastro-esophageal reflux disease without esophagitis: Secondary | ICD-10-CM | POA: Diagnosis not present

## 2015-11-16 HISTORY — PX: COLONOSCOPY: SHX5424

## 2015-11-16 HISTORY — DX: Hypothyroidism, unspecified: E03.9

## 2015-11-16 HISTORY — DX: Malignant (primary) neoplasm, unspecified: C80.1

## 2015-11-16 SURGERY — COLONOSCOPY
Anesthesia: Moderate Sedation

## 2015-11-16 MED ORDER — STERILE WATER FOR IRRIGATION IR SOLN
Status: DC | PRN
  Administered 2015-11-16: 10:00:00

## 2015-11-16 MED ORDER — ONDANSETRON HCL 4 MG/2ML IJ SOLN
INTRAMUSCULAR | Status: AC
  Filled 2015-11-16: qty 2

## 2015-11-16 MED ORDER — MEPERIDINE HCL 100 MG/ML IJ SOLN
INTRAMUSCULAR | Status: AC
  Filled 2015-11-16: qty 2

## 2015-11-16 MED ORDER — MIDAZOLAM HCL 5 MG/5ML IJ SOLN
INTRAMUSCULAR | Status: DC | PRN
  Administered 2015-11-16: 2 mg via INTRAVENOUS
  Administered 2015-11-16 (×2): 1 mg via INTRAVENOUS

## 2015-11-16 MED ORDER — SODIUM CHLORIDE 0.9 % IV SOLN
INTRAVENOUS | Status: DC
  Administered 2015-11-16: 10:00:00 via INTRAVENOUS

## 2015-11-16 MED ORDER — MIDAZOLAM HCL 5 MG/5ML IJ SOLN
INTRAMUSCULAR | Status: AC
  Filled 2015-11-16: qty 10

## 2015-11-16 MED ORDER — MEPERIDINE HCL 100 MG/ML IJ SOLN
INTRAMUSCULAR | Status: DC | PRN
  Administered 2015-11-16: 25 mg via INTRAVENOUS
  Administered 2015-11-16: 50 mg via INTRAVENOUS

## 2015-11-16 MED ORDER — ONDANSETRON HCL 4 MG/2ML IJ SOLN
INTRAMUSCULAR | Status: DC | PRN
  Administered 2015-11-16: 4 mg via INTRAVENOUS

## 2015-11-16 NOTE — Op Note (Signed)
[facility] Patient Name: Joanne Le Procedure Date: 11/16/2015 9:49 AM MRN: [id number] Date of Birth: [birth year] Attending MD: Norvel Richards , MD CSN: [id number] Age: 50 Admit Type: Outpatient Procedure:                Colonoscopy - Average risk screening Indications:              Screening for colorectal malignant neoplasm Providers:                Norvel Richards, MD, Rosina Lowenstein, RN, Shelby Mattocks, Technician Referring MD:              Medicines:                Midazolam 4 mg IV, Meperidine 75 mg IV, Ondansetron                            4 mg IV Complications:            No immediate complications. Estimated Blood Loss:     Estimated blood loss: none. Procedure:                Pre-Anesthesia Assessment:                           - Prior to the procedure, a History and Physical                            was performed, and patient medications and                            allergies were reviewed. The patient's tolerance of                            previous anesthesia was also reviewed. The risks                            and benefits of the procedure and the sedation                            options and risks were discussed with the patient.                            All questions were answered, and informed consent                            was obtained. Prior Anticoagulants: The patient has                            taken no previous anticoagulant or antiplatelet                            agents. ASA Grade Assessment: II - A patient with  mild systemic disease. After reviewing the risks                            and benefits, the patient was deemed in                            satisfactory condition to undergo the procedure.                           After obtaining informed consent, the colonoscope                            was passed under direct vision. Throughout the            procedure, the patient's blood pressure, pulse, and                            oxygen saturations were monitored continuously. The                            [id number]) was introduced through the anus                            and advanced to the the cecum, identified by                            appendiceal orifice and ileocecal valve. The                            colonoscopy was performed without difficulty. The                            patient tolerated the procedure well. The quality                            of the bowel preparation was adequate. The                            ileocecal valve, appendiceal orifice, and rectum                            were photographed. The entire colon was well                            visualized. Scope In: 10:03:35 AM Scope Out: 10:22:10 AM Scope Withdrawal Time: 0 hours 12 minutes 16 seconds  Total Procedure Duration: 0 hours 18 minutes 35 seconds  Findings:      The perianal and digital rectal examinations were normal.      Scattered small-mouthed diverticula were found in the sigmoid colon and       descending colon.      The exam was otherwise normal throughout the examined colon.      The retroflexed view of the distal rectum and anal verge was normal and       showed no anal or rectal abnormalities. Impression:               -  Mild diverticulosis in the sigmoid colon and in                            the descending colon.                           - The distal rectum and anal verge are normal on                            retroflexion view.                           - No specimens collected. Moderate Sedation:      Moderate (conscious) sedation was administered by the endoscopy nurse       and supervised by the endoscopist. The following parameters were       monitored: oxygen saturation, heart rate, blood pressure, and response       to care. Total physician intraservice time was 23 minutes. Recommendation:            - Patient has a contact number available for                            emergencies. The signs and symptoms of potential                            delayed complications were discussed with the                            patient. Return to normal activities tomorrow.                            Written discharge instructions were provided to the                            patient.                           - Advance diet as tolerated.                           - Continue present medications.                           - Repeat colonoscopy in 10 years for screening                            purposes. Procedure Code(s):        --- Professional ---                           [phone], Colonoscopy, flexible; diagnostic, including                            collection of specimen(s) by brushing or washing,  when performed (separate procedure)                           99152, Moderate sedation services provided by the                            same physician or other qualified health care                            professional performing the diagnostic or                            therapeutic service that the sedation supports,                            requiring the presence of an independent trained                            observer to assist in the monitoring of the                            patient's level of consciousness and physiological                            status; initial 15 minutes of intraservice time,                            patient age 71 years or older                           [phone], Moderate sedation services; each additional                            15 minutes intraservice time Diagnosis Code(s):        --- Professional ---                           Z12.11, Encounter for screening for malignant                            neoplasm of colon                           K57.30, Diverticulosis of large intestine without                             perforation or abscess without bleeding CPT copyright 2016 American Medical Association. All rights reserved. The codes documented in this report are preliminary and upon coder review may  be revised to meet current compliance requirements. Cristopher Estimable. Rob Mciver, MD Norvel Richards, MD 11/16/2015 10:29:15 AM This report has been signed electronically. Number of Addenda: 0

## 2015-11-16 NOTE — H&P (Signed)
$'@LOGO'P$ @   Primary Care Physician:  Young Berry, MD Primary Gastroenterologist:  Dr. Gala Romney  Pre-Procedure History & Physical: HPI:  Joanne Le is a 50 y.o. female is here for a screening colonoscopy.  Here for first ever average risk screening colonoscopy. No bowel symptoms. No prior colonoscopy. No family history of colon cancer with primary care provider is Delman Cheadle at [facility].  Past Medical History  Diagnosis Date  . Tobacco abuse   . Nodule of right lung     lower lobe  . Diabetes mellitus     type 2  . HTN (hypertension)   . Dyslipidemia   . 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 GERD (gastroesophageal reflux disease)   . Hypothyroidism   . Cancer ([place])     lung    Past Surgical History  Procedure Laterality Date  . Right vats, mini thoracotomy, wedge right lower  06/28/2009  . Cesarean section    . Tubal ligation    . Bone resection      Prior to Admission medications   Medication Sig Start Date End Date Taking? Authorizing Provider  aspirin 81 MG tablet Take 81 mg by mouth daily.   Yes Historical Provider, MD  ibuprofen (ADVIL,MOTRIN) 200 MG tablet Take 200 mg by mouth every 8 (eight) hours as needed for moderate pain.    Yes Historical Provider, MD  levothyroxine (SYNTHROID, LEVOTHROID) 25 MCG tablet Take 25 mcg by mouth daily before breakfast.   Yes Historical Provider, MD  lisinopril-hydrochlorothiazide (PRINZIDE,ZESTORETIC) 20-25 MG tablet Take 1 tablet by mouth daily.   Yes Historical Provider, MD  polyethylene glycol-electrolytes (TRILYTE) 420 g solution Take 4,000 mLs by mouth as directed. 10/25/15  Yes Daneil Dolin, MD  pravastatin (PRAVACHOL) 20 MG tablet Take 20 mg by mouth daily.   Yes Historical Provider, MD    Allergies as of 10/25/2015 - Review Complete 04/21/2014  Allergen Reaction Noted  . Penicillins  02/28/2011    Family History  Problem Relation Age of Onset  . Diabetes Father   .  Hypertension Father   . Hyperlipidemia Father   . Stroke Father   . Heart attack Father   . Osteoarthritis Mother   . Diabetes Paternal Grandfather   . Hyperlipidemia Paternal Grandfather   . Hypertension Paternal Grandfather   . Hypertension Paternal Grandmother   . Hyperlipidemia Paternal Grandmother   . Stroke Paternal Grandmother     Social History   Social History  . Marital Status: Single    Spouse Name: N/A  . Number of Children: 2  . Years of Education: N/A   Occupational History  .     Social History Main Topics  . Smoking status: Former Smoker    Quit date: 03/25/2009  . Smokeless tobacco: Not on file  . Alcohol Use: Yes     Comment: not on a reg. basis  . Drug Use: No  . Sexual Activity: Not on file   Other Topics Concern  . Not on file   Social History Narrative    Review of Systems: See HPI, otherwise negative ROS  Physical Exam: BP 149/67 mmHg  Pulse 74  Temp(Src) 98.5 F (36.9 C) (Oral)  Resp 19  Ht '5\' 3"'$  (1.6 m)  Wt 208 lb (94.348 kg)  BMI 36.85 kg/m2  SpO2 97%  LMP 02/20/2011 General:   Alert,  Well-developed, well-nourished, pleasant and cooperative in NAD Lungs:  Clear throughout to auscultation.  No wheezes, crackles, or rhonchi. No acute distress. Heart:  Regular rate and rhythm; no murmurs, clicks, rubs,  or gallops. Abdomen:  Soft, nontender and nondistended. No masses, hepatosplenomegaly or hernias noted. Normal bowel sounds, without guarding, and without rebound.   Extremities:  Without clubbing or edema. Neurologic:  Alert and  oriented x4;  grossly normal neurologically.  Impression/Plan: Joanne Le is now here to undergo a screening colonoscopy.  First ever average risk screening examination.  Risks, benefits, limitations, imponderables and alternatives regarding colonoscopy have been reviewed with the patient. Questions have been answered. All parties agreeable.     Notice:  This dictation was prepared with Dragon  dictation along with smaller phrase technology. Any transcriptional errors that result from this process are unintentional and may not be corrected upon review.

## 2015-11-16 NOTE — Discharge Instructions (Signed)
Colonoscopy Discharge Instructions  Read the instructions outlined below and refer to this sheet in the next few weeks. These discharge instructions provide you with general information on caring for yourself after you leave the hospital. Your doctor may also give you specific instructions. While your treatment has been planned according to the most current medical practices available, unavoidable complications occasionally occur. If you have any problems or questions after discharge, call Dr. Gala Romney at [phone]. ACTIVITY  You may resume your regular activity, but move at a slower pace for the next 24 hours.   Take frequent rest periods for the next 24 hours.   Walking will help get rid of the air and reduce the bloated feeling in your belly (abdomen).   No driving for 24 hours (because of the medicine (anesthesia) used during the test).    Do not sign any important legal documents or operate any machinery for 24 hours (because of the anesthesia used during the test).  NUTRITION  Drink plenty of fluids.   You may resume your normal diet as instructed by your doctor.   Begin with a light meal and progress to your normal diet. Heavy or fried foods are harder to digest and may make you feel sick to your stomach (nauseated).   Avoid alcoholic beverages for 24 hours or as instructed.  MEDICATIONS  You may resume your normal medications unless your doctor tells you otherwise.  WHAT YOU CAN EXPECT TODAY  Some feelings of bloating in the abdomen.   Passage of more gas than usual.   Spotting of blood in your stool or on the toilet paper.  IF YOU HAD POLYPS REMOVED DURING THE COLONOSCOPY:  No aspirin products for 7 days or as instructed.   No alcohol for 7 days or as instructed.   Eat a soft diet for the next 24 hours.  FINDING OUT THE RESULTS OF YOUR TEST Not all test results are available during your visit. If your test results are not back during the visit, make an appointment  with your caregiver to find out the results. Do not assume everything is normal if you have not heard from your caregiver or the medical facility. It is important for you to follow up on all of your test results.  SEEK IMMEDIATE MEDICAL ATTENTION IF:  You have more than a spotting of blood in your stool.   Your belly is swollen (abdominal distention).   You are nauseated or vomiting.   You have a temperature over 101.   You have abdominal pain or discomfort that is severe or gets worse throughout the day.    Repeat screening colonoscopy in 10 years Diverticulosis Diverticulosis is the condition that develops when small pouches (diverticula) form in the wall of your colon. Your colon, or large intestine, is where water is absorbed and stool is formed. The pouches form when the inside layer of your colon pushes through weak spots in the outer layers of your colon. CAUSES  No one knows exactly what causes diverticulosis. RISK FACTORS  Being older than 75. Your risk for this condition increases with age. Diverticulosis is rare in people younger than 40 years. By age 36, almost everyone has it.  Eating a low-fiber diet.  Being frequently constipated.  Being overweight.  Not getting enough exercise.  Smoking.  Taking over-the-counter pain medicines, like aspirin and ibuprofen. SYMPTOMS  Most people with diverticulosis do not have symptoms. DIAGNOSIS  Because diverticulosis often has no symptoms, health care providers often discover  the condition during an exam for other colon problems. In many cases, a health care provider will diagnose diverticulosis while using a flexible scope to examine the colon (colonoscopy). TREATMENT  If you have never developed an infection related to diverticulosis, you may not need treatment. If you have had an infection before, treatment may include:  Eating more fruits, vegetables, and grains.  Taking a fiber supplement.  Taking a live bacteria  supplement (probiotic).  Taking medicine to relax your colon. HOME CARE INSTRUCTIONS   Drink at least 6-8 glasses of water each day to prevent constipation.  Try not to strain when you have a bowel movement.  Keep all follow-up appointments. If you have had an infection before:  Increase the fiber in your diet as directed by your health care provider or dietitian.  Take a dietary fiber supplement if your health care provider approves.  Only take medicines as directed by your health care provider. SEEK MEDICAL CARE IF:   You have abdominal pain.  You have bloating.  You have cramps.  You have not gone to the bathroom in 3 days. SEEK IMMEDIATE MEDICAL CARE IF:   Your pain gets worse.  Yourbloating becomes very bad.  You have a fever or chills, and your symptoms suddenly get worse.  You begin vomiting.  You have bowel movements that are bloody or black. MAKE SURE YOU:  Understand these instructions.  Will watch your condition.  Will get help right away if you are not doing well or get worse.   This information is not intended to replace advice given to you by your health care provider. Make sure you discuss any questions you have with your health care provider.   Document Released: 01/20/2004 Document Revised: 04/29/2013 Document Reviewed: 03/19/2013 Elsevier Interactive Patient Education 2016 Elsevier Inc.  High-Fiber Diet Fiber, also called dietary fiber, is a type of carbohydrate found in fruits, vegetables, whole grains, and beans. A high-fiber diet can have many health benefits. Your health care provider may recommend a high-fiber diet to help:  Prevent constipation. Fiber can make your bowel movements more regular.  Lower your cholesterol.  Relieve hemorrhoids, uncomplicated diverticulosis, or irritable bowel syndrome.  Prevent overeating as part of a weight-loss plan.  Prevent heart disease, type 2 diabetes, and certain cancers. WHAT IS MY  PLAN? The recommended daily intake of fiber includes:  38 grams for men under age 17.  56 grams for men over age 32.  74 grams for women under age 15.  86 grams for women over age 54. You can get the recommended daily intake of dietary fiber by eating a variety of fruits, vegetables, grains, and beans. Your health care provider may also recommend a fiber supplement if it is not possible to get enough fiber through your diet. WHAT DO I NEED TO KNOW ABOUT A HIGH-FIBER DIET?  Fiber supplements have not been widely studied for their effectiveness, so it is better to get fiber through food sources.  Always check the fiber content on thenutrition facts label of any prepackaged food. Look for foods that contain at least 5 grams of fiber per serving.  Ask your dietitian if you have questions about specific foods that are related to your condition, especially if those foods are not listed in the following section.  Increase your daily fiber consumption gradually. Increasing your intake of dietary fiber too quickly may cause bloating, cramping, or gas.  Drink plenty of water. Water helps you to digest fiber. WHAT FOODS CAN  I EAT? Grains Whole-grain breads. Multigrain cereal. Oats and oatmeal. [place] rice. Barley. Bulgur wheat. [place]. Bran muffins. Popcorn. Rye wafer crackers. Vegetables Sweet potatoes. Spinach. Kale. Artichokes. Cabbage. Broccoli. Green peas. Carrots. Squash. Fruits Berries. Pears. Apples. Oranges. Avocados. Prunes and raisins. Dried figs. Meats and Other Protein Sources Navy, kidney, pinto, and soy beans. Split peas. Lentils. Nuts and seeds. Dairy Fiber-fortified yogurt. Beverages Fiber-fortified soy milk. Fiber-fortified orange juice. Other Fiber bars. The items listed above may not be a complete list of recommended foods or beverages. Contact your dietitian for more options. WHAT FOODS ARE NOT RECOMMENDED? Grains White bread. Pasta made with refined flour. White  rice. Vegetables Fried potatoes. Canned vegetables. Well-cooked vegetables.  Fruits Fruit juice. Cooked, strained fruit. Meats and Other Protein Sources Fatty cuts of meat. Fried Sales executive or fried fish. Dairy Milk. Yogurt. Cream cheese. Sour cream. Beverages Soft drinks. Other Cakes and pastries. Butter and oils. The items listed above may not be a complete list of foods and beverages to avoid. Contact your dietitian for more information. WHAT ARE SOME TIPS FOR INCLUDING HIGH-FIBER FOODS IN MY DIET?  Eat a wide variety of high-fiber foods.  Make sure that half of all grains consumed each day are whole grains.  Replace breads and cereals made from refined flour or white flour with whole-grain breads and cereals.  Replace white rice with [place] rice, bulgur wheat, or millet.  Start the day with a breakfast that is high in fiber, such as a cereal that contains at least 5 grams of fiber per serving.  Use beans in place of meat in soups, salads, or pasta.  Eat high-fiber snacks, such as berries, raw vegetables, nuts, or popcorn.   This information is not intended to replace advice given to you by your health care provider. Make sure you discuss any questions you have with your health care provider.   Document Released: 04/24/2005 Document Revised: 05/15/2014 Document Reviewed: 10/07/2013 Elsevier Interactive Patient Education [employer].

## 2015-11-18 ENCOUNTER — Encounter (HOSPITAL_COMMUNITY): Payer: Self-pay | Admitting: Internal Medicine

## 2022-06-29 ENCOUNTER — Encounter (HOSPITAL_BASED_OUTPATIENT_CLINIC_OR_DEPARTMENT_OTHER): Payer: Self-pay

## 2022-06-29 DIAGNOSIS — R5383 Other fatigue: Secondary | ICD-10-CM

## 2022-06-29 DIAGNOSIS — R0683 Snoring: Secondary | ICD-10-CM

## 2022-08-15 ENCOUNTER — Ambulatory Visit: Payer: Medicaid Other | Attending: General Practice | Admitting: Pulmonary Disease

## 2022-08-15 DIAGNOSIS — G4761 Periodic limb movement disorder: Secondary | ICD-10-CM | POA: Insufficient documentation

## 2022-08-15 DIAGNOSIS — G2581 Restless legs syndrome: Secondary | ICD-10-CM | POA: Insufficient documentation

## 2022-08-15 DIAGNOSIS — R0683 Snoring: Secondary | ICD-10-CM

## 2022-08-15 DIAGNOSIS — G4733 Obstructive sleep apnea (adult) (pediatric): Secondary | ICD-10-CM | POA: Diagnosis present

## 2022-08-15 DIAGNOSIS — R5383 Other fatigue: Secondary | ICD-10-CM

## 2022-08-16 DIAGNOSIS — R0683 Snoring: Secondary | ICD-10-CM

## 2022-08-16 NOTE — Procedures (Signed)
      Patient Name: Joanne Le, Joanne Le Date: 08/15/2022 Gender: Female D.O.B: August 21, 1965 Age (years): 57 Referring Provider: Bridgette Habermann NP Height (inches): 63 Interpreting Physician: Coralyn Helling MD, ABSM Weight (lbs): 205 RPSGT: Alfonso Ellis BMI: 36 MRN: [id number] Neck Size: 17.50  CLINICAL INFORMATION Sleep Study Type: NPSG  Indication for sleep study: snoring, sleep disruption and daytime sleepiness.  Epworth Sleepiness Score: 5  SLEEP STUDY TECHNIQUE As per the AASM Manual for the Scoring of Sleep and Associated Events v2.3 (April 2016) with a hypopnea requiring 4% desaturations.  The channels recorded and monitored were frontal, central and occipital EEG, electrooculogram (EOG), submentalis EMG (chin), nasal and oral airflow, thoracic and abdominal wall motion, anterior tibialis EMG, snore microphone, electrocardiogram, and pulse oximetry.  MEDICATIONS Medications self-administered by patient taken the night of the study : N/A  SLEEP ARCHITECTURE The study was initiated at 10:02:43 PM and ended at 4:32:13 AM.  Sleep onset time was 2.8 minutes and the sleep efficiency was 88.1%. The total sleep time was 343.2 minutes.  Stage REM latency was 134.5 minutes.  The patient spent 4.95% of the night in stage N1 sleep, 61.69% in stage N2 sleep, 24.91% in stage N3 and 8.5% in REM.  Alpha intrusion was absent.  Supine sleep was 3.50%.  RESPIRATORY PARAMETERS The overall apnea/hypopnea index (AHI) was 13.1 per hour. There were 4 total apneas, including 4 obstructive, 0 central and 0 mixed apneas. There were 71 hypopneas and 0 RERAs.  The AHI during Stage REM sleep was 26.9 per hour.  AHI while supine was 50.0 per hour.  The mean oxygen saturation was 92.74%. The minimum SpO2 during sleep was 81.00%.  Loud snoring was noted during this study.  CARDIAC DATA The 2 lead EKG demonstrated sinus rhythm. The mean heart rate was 63.79 beats per minute. Other EKG  findings include: None.  LEG MOVEMENT DATA The total PLMS were 122 with a resulting PLMS index of 21.33. Associated arousal with leg movement index was 2.6 .  IMPRESSIONS - Mild obstructive sleep apnea with an overall AHI of 13.1 and SpO2 low of 81%.  She had a significant REM and supine effect to her sleep apnea. - The patient snored with loud snoring volume. - No cardiac abnormalities were noted during this study. 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 No significant associated arousals.  DIAGNOSIS - Obstructive Sleep Apnea  - Periodic Limb Movements of Sleep  RECOMMENDATIONS - Additional therapies for obstructive sleep apnea include CPAP, oral appliance, or surgical assessment. - She should be assessed for the presence of restless leg syndrome. - Avoid alcohol, sedatives and other CNS depressants that may worsen sleep apnea and disrupt normal sleep architecture. - Sleep hygiene should be reviewed to assess factors that may improve sleep quality. - Weight management and regular exercise should be initiated or continued if appropriate. - Sleep medicine consultation available as needed to assist with management.  [Electronically signed] 08/16/2022 01:09 PM  Coralyn Helling MD, ABSM Diplomate, American Board of Sleep Medicine NPI: [id number]  [facility] PH: [phone]   FX: [phone] ACCREDITED BY THE AMERICAN ACADEMY OF SLEEP MEDICINE

## 2022-10-12 ENCOUNTER — Encounter (HOSPITAL_COMMUNITY): Payer: Self-pay | Admitting: Emergency Medicine

## 2022-10-12 ENCOUNTER — Emergency Department (HOSPITAL_COMMUNITY): Payer: Medicaid Other

## 2022-10-12 ENCOUNTER — Emergency Department (HOSPITAL_COMMUNITY)
Admission: EM | Admit: 2022-10-12 | Discharge: 2022-10-12 | Disposition: A | Discharge status: Home / Self Care | Payer: Medicaid Other | Attending: Emergency Medicine | Admitting: Emergency Medicine

## 2022-10-12 ENCOUNTER — Other Ambulatory Visit: Payer: Self-pay

## 2022-10-12 DIAGNOSIS — E871 Hypo-osmolality and hyponatremia: Secondary | ICD-10-CM | POA: Insufficient documentation

## 2022-10-12 DIAGNOSIS — Z79890 Hormone replacement therapy (postmenopausal): Secondary | ICD-10-CM | POA: Diagnosis not present

## 2022-10-12 DIAGNOSIS — E119 Type 2 diabetes mellitus without complications: Secondary | ICD-10-CM | POA: Diagnosis not present

## 2022-10-12 DIAGNOSIS — I10 Essential (primary) hypertension: Secondary | ICD-10-CM | POA: Diagnosis not present

## 2022-10-12 DIAGNOSIS — R519 Headache, unspecified: Secondary | ICD-10-CM | POA: Insufficient documentation

## 2022-10-12 DIAGNOSIS — E878 Other disorders of electrolyte and fluid balance, not elsewhere classified: Secondary | ICD-10-CM | POA: Diagnosis not present

## 2022-10-12 DIAGNOSIS — Z7982 Long term (current) use of aspirin: Secondary | ICD-10-CM | POA: Diagnosis not present

## 2022-10-12 DIAGNOSIS — Z85110 Personal history of malignant carcinoid tumor of bronchus and lung: Secondary | ICD-10-CM | POA: Diagnosis not present

## 2022-10-12 DIAGNOSIS — Z1152 Encounter for screening for COVID-19: Secondary | ICD-10-CM | POA: Insufficient documentation

## 2022-10-12 DIAGNOSIS — E039 Hypothyroidism, unspecified: Secondary | ICD-10-CM | POA: Insufficient documentation

## 2022-10-12 DIAGNOSIS — Z7984 Long term (current) use of oral hypoglycemic drugs: Secondary | ICD-10-CM | POA: Diagnosis not present

## 2022-10-12 DIAGNOSIS — Z79899 Other long term (current) drug therapy: Secondary | ICD-10-CM | POA: Diagnosis not present

## 2022-10-12 LAB — CBC WITH DIFFERENTIAL/PLATELET
Abs Immature Granulocytes: 0.06 10*3/uL (ref 0.00–0.07)
Basophils Absolute: 0.1 10*3/uL (ref 0.0–0.1)
Basophils Relative: 1 %
Eosinophils Absolute: 0 10*3/uL (ref 0.0–0.5)
Eosinophils Relative: 0 %
HCT: 41.6 % (ref 36.0–46.0)
Hemoglobin: 14.1 g/dL (ref 12.0–15.0)
Immature Granulocytes: 1 %
Lymphocytes Relative: 16 %
Lymphs Abs: 1.7 10*3/uL (ref 0.7–4.0)
MCH: 30 pg (ref 26.0–34.0)
MCHC: 33.9 g/dL (ref 30.0–36.0)
MCV: 88.5 fL (ref 80.0–100.0)
Monocytes Absolute: 0.8 10*3/uL (ref 0.1–1.0)
Monocytes Relative: 7 %
Neutro Abs: 7.9 10*3/uL — ABNORMAL HIGH (ref 1.7–7.7)
Neutrophils Relative %: 75 %
Platelets: 327 10*3/uL (ref 150–400)
RBC: 4.7 MIL/uL (ref 3.87–5.11)
RDW: 12.1 % (ref 11.5–15.5)
WBC: 10.5 10*3/uL (ref 4.0–10.5)
nRBC: 0 % (ref 0.0–0.2)

## 2022-10-12 LAB — COMPREHENSIVE METABOLIC PANEL
ALT: 25 U/L (ref 0–44)
AST: 18 U/L (ref 15–41)
Albumin: 3.8 g/dL (ref 3.5–5.0)
Alkaline Phosphatase: 93 U/L (ref 38–126)
Anion gap: 10 (ref 5–15)
BUN: 15 mg/dL (ref 6–20)
CO2: 26 mmol/L (ref 22–32)
Calcium: 9.4 mg/dL (ref 8.9–10.3)
Chloride: 97 mmol/L — ABNORMAL LOW (ref 98–111)
Creatinine, Ser: 0.71 mg/dL (ref 0.44–1.00)
GFR, Estimated: >60 mL/min (ref >60)
Glucose, Bld: 148 mg/dL — ABNORMAL HIGH (ref 70–99)
Potassium: 3.9 mmol/L (ref 3.5–5.1)
Sodium: 133 mmol/L — ABNORMAL LOW (ref 135–145)
Total Bilirubin: 1.1 mg/dL (ref 0.3–1.2)
Total Protein: 7.2 g/dL (ref 6.5–8.1)

## 2022-10-12 LAB — SARS CORONAVIRUS 2 BY RT PCR: SARS Coronavirus 2 by RT PCR: NEGATIVE

## 2022-10-12 LAB — TROPONIN I (HIGH SENSITIVITY): Troponin I (High Sensitivity): 4 ng/L (ref <18)

## 2022-10-12 MED ORDER — SODIUM CHLORIDE 0.9 % IV BOLUS
1000.0000 mL | Freq: Once | INTRAVENOUS | Status: AC
  Administered 2022-10-12: 1000 mL via INTRAVENOUS

## 2022-10-12 MED ORDER — METOCLOPRAMIDE HCL 5 MG/ML IJ SOLN
10.0000 mg | Freq: Once | INTRAMUSCULAR | Status: AC
  Administered 2022-10-12: 10 mg via INTRAVENOUS
  Filled 2022-10-12: qty 2

## 2022-10-12 MED ORDER — ONDANSETRON 4 MG PO TBDP: 4.0000 mg | ORAL_TABLET | Freq: Three times a day (TID) | ORAL | 0 refills | Status: DC | PRN

## 2022-10-12 MED ORDER — KETOROLAC TROMETHAMINE 15 MG/ML IJ SOLN
15.0000 mg | Freq: Once | INTRAMUSCULAR | Status: AC
  Administered 2022-10-12: 15 mg via INTRAVENOUS
  Filled 2022-10-12: qty 1

## 2022-10-12 MED ORDER — DIPHENHYDRAMINE HCL 50 MG/ML IJ SOLN
25.0000 mg | Freq: Once | INTRAMUSCULAR | Status: AC
  Administered 2022-10-12: 25 mg via INTRAVENOUS
  Filled 2022-10-12: qty 1

## 2022-10-12 NOTE — Discharge Instructions (Addendum)
As discussed, workup overall reassuring.  CT imaging was without signs of abnormality.  Will send in nausea medicine to take as needed for feelings of nausea or subsequent emesis.  Recommend close follow-up with primary care in the outpatient setting for reevaluation of your symptoms.  Please do not hesitate to return to emergency department for worrisome signs and symptoms we discussed become apparent.

## 2022-10-12 NOTE — ED Triage Notes (Signed)
Pt c/o headache with vomiting since Tuesday.

## 2022-10-12 NOTE — ED Provider Notes (Signed)
[facility] EMERGENCY DEPARTMENT AT [facility] Provider Note   CSN: [id number] Arrival date & time: 10/12/22  1619     History  Chief Complaint  Patient presents with   Headache    Joanne Le is a 57 y.o. female.   Headache   57 year old female presents emergency department with complaints of headache.  Patient states that headache began gradually on Tuesday and has been persistent and gradually worsening since onset.  Reports history of headache but usually only lasts a matter of minutes to an hour before resolution occurs.  Reports pain with initial beginning in occipital region with radiation to forehead.  Reports light sensitivity.  Denies any visual disturbance, gait abnormality, slurred speech, facial droop, weakness or sensory deficits in upper or lower extremities.  Denies fever, neck stiffness/rigidity, chest pain, shortness of breath, abdominal pain.  Has reported feelings of nausea with subsequent emesis with decreased p.o. intake since symptom onset.  Has been trying Tylenol at home without significant improvement of symptoms.  Past medical history significant for carcinoid tumor of lung, diabetes mellitus, GERD, dyslipidemia, hypertension, hypothyroidism, pulmonary nodule  Home Medications Prior to Admission medications   Medication Sig Start Date End Date Taking? Authorizing Provider  amLODipine (NORVASC) 5 MG tablet Take 5 mg by mouth daily. 08/08/17  Yes [provider]  aspirin 81 MG tablet Take 81 mg by mouth daily.   Yes [provider]  atorvastatin (LIPITOR) 20 MG tablet Take 20 mg by mouth every evening. 09/24/17  Yes [provider]  celecoxib (CELEBREX) 50 MG capsule Take 50 mg by mouth 2 (two) times daily. 07/31/22  Yes [provider]  Cholecalciferol 50 MCG (2000 UT) TABS Take by mouth. 05/23/18  Yes [provider]  diclofenac Sodium (VOLTAREN) 1 % GEL Apply topically. 11/07/21 07/13/23 Yes [provider]  levothyroxine (SYNTHROID, LEVOTHROID) 25 MCG tablet Take 25 mcg by mouth daily before breakfast.   Yes [provider]  lisinopril-hydrochlorothiazide (PRINZIDE,ZESTORETIC) 20-25 MG tablet Take 1 tablet by mouth daily.   Yes [provider]  metFORMIN (GLUCOPHAGE-XR) 500 MG 24 hr tablet Take 500 mg by mouth daily.   Yes [provider]  Omega-3 Fatty Acids (FISH OIL) 1200 MG CAPS Take 1 capsule by mouth 3 (three) times daily.   Yes [provider]  ondansetron (ZOFRAN-ODT) 4 MG disintegrating tablet Take 1 tablet (4 mg total) by mouth every 8 (eight) hours as needed for nausea or vomiting. 10/12/22  Yes Peter Garter, PA      Allergies    Penicillins    Review of Systems   Review of Systems  Neurological:  Positive for headaches.  All other systems reviewed and are negative.   Physical Exam Updated Vital Signs BP (!) 155/76   Pulse 84   Temp 99.2 F (37.3 C) (Oral)   Resp 17   Ht 5\' 3"  (1.6 m)   Wt 95 kg   LMP 02/20/2011   SpO2 96%   BMI 37.10 kg/m  Physical Exam Vitals and nursing note reviewed.  Constitutional:      General: She is not in acute distress.    Appearance: She is well-developed.  HENT:     Head: Normocephalic and atraumatic.  Eyes:     Conjunctiva/sclera: Conjunctivae normal.  Cardiovascular:     Rate and Rhythm: Normal rate and regular rhythm.     Heart sounds: No murmur heard. Pulmonary:     Effort: Pulmonary effort is normal.  No respiratory distress.     Breath sounds: Normal breath sounds.  Abdominal:     Palpations: Abdomen is soft.     Tenderness: There is no abdominal tenderness.  Musculoskeletal:        General: No swelling.     Cervical back: Neck supple.  Skin:    General: Skin is warm and dry.     Capillary Refill: Capillary refill takes less than 2 seconds.  Neurological:     Mental Status: She is alert.     GCS: GCS eye subscore is 4. GCS verbal subscore is 5. GCS motor subscore  is 6.     Comments: Alert and oriented to self, place, time and event.   Speech is fluent, clear without dysarthria or dysphasia.   Strength 5/5 in upper/lower extremities   Sensation intact in upper/lower extremities   Normal gait.  CN I not tested  CN II not tested CN III, IV, VI PERRLA and EOMs intact bilaterally  CN V Intact sensation to sharp and light touch to the face  CN VII facial movements symmetric  CN VIII not tested  CN IX, X no uvula deviation, symmetric rise of soft palate  CN XI 5/5 SCM and trapezius strength bilaterally  CN XII Midline tongue protrusion, symmetric L/R movements   Psychiatric:        Mood and Affect: Mood normal.     ED Results / Procedures / Treatments   Labs (all labs ordered are listed, but only abnormal results are displayed) Labs Reviewed  COMPREHENSIVE METABOLIC PANEL - Abnormal; Notable for the following components:      Result Value   Sodium 133 (*)    Chloride 97 (*)    Glucose, Bld 148 (*)    All other components within normal limits  CBC WITH DIFFERENTIAL/PLATELET - Abnormal; Notable for the following components:   Neutro Abs 7.9 (*)    All other components within normal limits  SARS CORONAVIRUS 2 BY RT PCR  TROPONIN I (HIGH SENSITIVITY)    EKG None  Radiology CT Head Wo Contrast  Result Date: 10/12/2022 CLINICAL DATA:  Headache and vomiting. EXAM: CT HEAD WITHOUT CONTRAST TECHNIQUE: Contiguous axial images were obtained from the base of the skull through the vertex without intravenous contrast. RADIATION DOSE REDUCTION: This exam was performed according to the departmental dose-optimization program which includes automated exposure control, adjustment of the mA and/or kV according to patient size and/or use of iterative reconstruction technique. COMPARISON:  None Available. FINDINGS: Brain: No evidence of acute infarction, hemorrhage, hydrocephalus, extra-axial collection or mass lesion/mass effect. Vascular: No hyperdense  vessel or unexpected calcification. Skull: Normal. Negative for fracture or focal lesion. Sinuses/Orbits: No acute finding. Other: None. IMPRESSION: Normal head CT. Electronically Signed   By: Aram Candela M.D.   On: 10/12/2022 18:04    Procedures Procedures    Medications Ordered in ED Medications  sodium chloride 0.9 % bolus 1,000 mL (0 mLs Intravenous Stopped 10/12/22 1816)  ketorolac (TORADOL) 15 MG/ML injection 15 mg (15 mg Intravenous Given 10/12/22 1649)  metoCLOPramide (REGLAN) injection 10 mg (10 mg Intravenous Given 10/12/22 1648)  diphenhydrAMINE (BENADRYL) injection 25 mg (25 mg Intravenous Given 10/12/22 1648)    ED Course/ Medical Decision Making/ A&P                             Medical Decision Making Amount and/or Complexity of Data Reviewed Labs: ordered. Radiology: ordered.  Risk Prescription drug management.   This patient presents to the ED for concern of headache, this involves an extensive number of treatment options, and is a complaint that carries with it a high risk of complications and morbidity.  The differential diagnosis includes CVA, cerebral venous thrombosis, migraine/tension/cluster headache, encephalitis/meningitis, malignancy, electrolyte derangement, mass   Co morbidities that complicate the patient evaluation  See HPI   Additional history obtained:  Additional history obtained from EMR External records from outside source obtained and reviewed including hospital records   Lab Tests:  I Ordered, and personally interpreted labs.  The pertinent results include: No leukocytosis noted.  No evidence of anemia.  Platelets within normal range.  Mild hyponatremia and hyperchloremia of 133 and 97 respectively but otherwise, no electrolyte abnormalities.  No transaminitis.  No renal dysfunction.  COVID-negative.  Troponin of 4   Imaging Studies ordered:  I ordered imaging studies including CT head I independently visualized and interpreted  imaging which showed no acute intracranial abnormality I agree with the radiologist interpretation   Cardiac Monitoring: / EKG:  The patient was maintained on a cardiac monitor.  I personally viewed and interpreted the cardiac monitored which showed an underlying rhythm of: Sinus rhythm   Consultations Obtained:  N/a   Problem List / ED Course / Critical interventions / Medication management  Headache I ordered medication including 1 L normal saline, Toradol, Benadryl, Reglan   Reevaluation of the patient after these medicines showed that the patient improved I have reviewed the patients home medicines and have made adjustments as needed   Social Determinants of Health:  Denies tobacco, illicit drug use   Test / Admission - Considered:  Headache Vitals signs significant for hypertension with blood pressure 155/76. Otherwise within normal range and stable throughout visit. Laboratory/imaging studies significant for: See above 57 year old female presents emergency department with complaints of headache since Tuesday gradual onset with worsening since symptom onset.  Patient without any acute neurologic deficit on exam but CT imaging was pursued given duration of patient's symptoms with worsening and no prior headaches similar in the past.  CT imaging was negative for any acute intracranial abnormality.  Patient noted significant improvement of symptoms reaching near resolution with administration of migraine cocktail.  Low suspicion for CVA, cerebral venous thrombosis.  Patient without fever, nuchal rigidity, leukocytosis so low suspicion for meningitis/encephalitis.  Headache more consistent with migraine type headache.  Patient recommended follow-up in the outpatient setting with primary care for reevaluation of symptoms.  Will be prescribed antiemetic in the meantime for treatment of any persistent/recurrent nausea/emesis.  Patient overall well-appearing, afebrile in no acute  distress, tolerating p.o. without difficulty.  Treatment plan discussed at length with patient and she acknowledged understanding was agreeable to said plan. Worrisome signs and symptoms were discussed with the patient, and the patient acknowledged understanding to return to the ED if noticed. Patient was stable upon discharge.          Final Clinical Impression(s) / ED Diagnoses Final diagnoses:  Nonintractable headache, unspecified chronicity pattern, unspecified headache type    Rx / DC Orders ED Discharge Orders          Ordered    ondansetron (ZOFRAN-ODT) 4 MG disintegrating tablet  Every 8 hours PRN        10/12/22 1815              Peter Garter, [place] 10/12/22 1826    Loetta Rough, MD 10/12/22 [phone]

## 2022-12-03 NOTE — Progress Notes (Unsigned)
Synopsis: Referred in July 2024 for pulmonary nodule by Bridgette Habermann, FNP  Subjective:   PATIENT ID: Joanne Le GENDER: female DOB: 1965/09/30, MRN: [id number]  No chief complaint on file.   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 Patient was referred for evaluation of pulmonary nodules.  Last documented CT imaging is completed in 2022 at [facility] health care which revealed multiple benign pulmonary nodules in the chest in comparison to previous studies and evidence of a right lower lobe wedge resection consistent with her previous surgery.  No evidence of recurrence of disease.    ***  Past Medical History:  Diagnosis Date   Cancer (HCC)    lung   Carcinoid tumor of lung     right lower lobe   Diabetes mellitus    type 2   Dyslipidemia    GERD (gastroesophageal reflux disease)    HTN (hypertension)    Hypothyroidism    Nodule of right lung    lower lobe   Tobacco abuse      Family History  Problem Relation Age of Onset   Diabetes Father    Hypertension Father    Hyperlipidemia Father    Stroke Father    Heart attack Father    Osteoarthritis Mother    Diabetes Paternal Grandfather    Hyperlipidemia Paternal Grandfather    Hypertension Paternal Grandfather    Hypertension Paternal Grandmother    Hyperlipidemia Paternal Grandmother    Stroke Paternal Grandmother      Past Surgical History:  Procedure Laterality Date   BONE RESECTION     CESAREAN SECTION     COLONOSCOPY N/A 11/16/2015   Procedure: COLONOSCOPY;  Surgeon: Corbin Ade, MD;  Location: [facility];  Service: Endoscopy;  Laterality: N/A;  1030   Right VATS, mini thoracotomy, wedge right lower  06/28/2009   TUBAL LIGATION      Social History   Socioeconomic History   Marital status: Single    Spouse name: Not on file   Number of children: 2   Years of education:  Not on file   Highest education level: Not on file  Occupational History    Employer: [employer]  Tobacco Use   Smoking status: Former    Current packs/day: 0.00    Types: Cigarettes    Quit date: 03/25/2009    Years since quitting: 13.7   Smokeless tobacco: Not on file  Substance and Sexual Activity   Alcohol use: Yes    Comment: not on a reg. basis   Drug use: No   Sexual activity: Not on file  Other Topics Concern   Not on file  Social History Narrative   Not on file   Social Determinants of Health   Financial Resource Strain: Not on file  Food Insecurity: Not on file  Transportation Needs: Not on file  Physical Activity: Not on file  Stress: Not on file  Social Connections: Not on file  Intimate Partner Violence: Not on file     Allergies  Allergen Reactions   Penicillins Hives    Has patient had a PCN reaction causing immediate rash, facial/tongue/throat swelling, SOB or lightheadedness with hypotension: No Has patient had a PCN reaction causing severe rash involving mucus membranes or skin necrosis: No Has patient had a PCN reaction that required hospitalization No Has patient had a PCN reaction occurring within the last 10 years:  No If all of the above answers are "NO", then may proceed with Cephalosporin use.      Outpatient Medications Prior to Visit  Medication Sig Dispense Refill   amLODipine (NORVASC) 5 MG tablet Take 5 mg by mouth daily.     aspirin 81 MG tablet Take 81 mg by mouth daily.     atorvastatin (LIPITOR) 20 MG tablet Take 20 mg by mouth every evening.     celecoxib (CELEBREX) 50 MG capsule Take 50 mg by mouth 2 (two) times daily.     Cholecalciferol 50 MCG (2000 UT) TABS Take by mouth.     diclofenac Sodium (VOLTAREN) 1 % GEL Apply topically.     levothyroxine (SYNTHROID, LEVOTHROID) 25 MCG tablet Take 25 mcg by mouth daily before breakfast.     lisinopril-hydrochlorothiazide (PRINZIDE,ZESTORETIC) 20-25 MG tablet Take 1 tablet by  mouth daily.     metFORMIN (GLUCOPHAGE-XR) 500 MG 24 hr tablet Take 500 mg by mouth daily.     Omega-3 Fatty Acids (FISH OIL) 1200 MG CAPS Take 1 capsule by mouth 3 (three) times daily.     ondansetron (ZOFRAN-ODT) 4 MG disintegrating tablet Take 1 tablet (4 mg total) by mouth every 8 (eight) hours as needed for nausea or vomiting. 20 tablet 0   No facility-administered medications prior to visit.    ROS   Objective:  Physical Exam   There were no vitals filed for this visit.   on *** LPM *** RA BMI Readings from Last 3 Encounters:  10/12/22 37.10 kg/m  11/16/15 36.85 kg/m  04/21/14 35.43 kg/m   Wt Readings from Last 3 Encounters:  10/12/22 209 lb 7 oz (95 kg)  11/16/15 208 lb (94.3 kg)  04/21/14 200 lb (90.7 kg)     CBC    Component Value Date/Time   WBC 10.5 10/12/2022 1639   RBC 4.70 10/12/2022 1639   HGB 14.1 10/12/2022 1639   HCT 41.6 10/12/2022 1639   PLT 327 10/12/2022 1639   MCV 88.5 10/12/2022 1639   MCH 30.0 10/12/2022 1639   MCHC 33.9 10/12/2022 1639   RDW 12.1 10/12/2022 1639   LYMPHSABS 1.7 10/12/2022 1639   MONOABS 0.8 10/12/2022 1639   EOSABS 0.0 10/12/2022 1639   BASOSABS 0.1 10/12/2022 1639    ***  Chest Imaging: ***  Pulmonary Functions Testing Results:     No data to display          FeNO: ***  Pathology: ***  Echocardiogram: ***  Heart Catheterization: ***    Assessment & Plan:     ICD-10-CM   1. Pulmonary nodules  R91.8     2. History of malignant carcinoid tumor of bronchus and lung  Z85.110     3. S/P wedge resection of lung by Dr. Dorris Fetch  Z90.2       Discussion: ***   Current Outpatient Medications:    amLODipine (NORVASC) 5 MG tablet, Take 5 mg by mouth daily., Disp: , Rfl:    aspirin 81 MG tablet, Take 81 mg by mouth daily., Disp: , Rfl:    atorvastatin (LIPITOR) 20 MG tablet, Take 20 mg by mouth every evening., Disp: , Rfl:    celecoxib (CELEBREX) 50 MG capsule, Take 50 mg by mouth 2 (two) times  daily., Disp: , Rfl:    Cholecalciferol 50 MCG (2000 UT) TABS, Take by mouth., Disp: , Rfl:    diclofenac Sodium (VOLTAREN) 1 % GEL, Apply topically., Disp: , Rfl:    levothyroxine (SYNTHROID, LEVOTHROID)  25 MCG tablet, Take 25 mcg by mouth daily before breakfast., Disp: , Rfl:    lisinopril-hydrochlorothiazide (PRINZIDE,ZESTORETIC) 20-25 MG tablet, Take 1 tablet by mouth daily., Disp: , Rfl:    metFORMIN (GLUCOPHAGE-XR) 500 MG 24 hr tablet, Take 500 mg by mouth daily., Disp: , Rfl:    Omega-3 Fatty Acids (FISH OIL) 1200 MG CAPS, Take 1 capsule by mouth 3 (three) times daily., Disp: , Rfl:    ondansetron (ZOFRAN-ODT) 4 MG disintegrating tablet, Take 1 tablet (4 mg total) by mouth every 8 (eight) hours as needed for nausea or vomiting., Disp: 20 tablet, Rfl: 0  I spent *** minutes dedicated to the care of this patient on the date of this encounter to include pre-visit review of records, face-to-face time with the patient discussing conditions above, post visit ordering of testing, clinical documentation with the electronic health record, making appropriate referrals as documented, and communicating necessary findings to members of the patients care team.   [name], DO [facility] 12/03/2022 2:32 PM

## 2022-12-04 ENCOUNTER — Ambulatory Visit (INDEPENDENT_AMBULATORY_CARE_PROVIDER_SITE_OTHER): Payer: Medicaid Other | Admitting: Pulmonary Disease

## 2022-12-04 ENCOUNTER — Encounter: Payer: Self-pay | Admitting: Pulmonary Disease

## 2022-12-04 VITALS — BP 120/60 | HR 62 | Ht 63.5 in | Wt 204.4 lb

## 2022-12-04 DIAGNOSIS — Z902 Acquired absence of lung [part of]: Secondary | ICD-10-CM

## 2022-12-04 DIAGNOSIS — Z85110 Personal history of malignant carcinoid tumor of bronchus and lung: Secondary | ICD-10-CM | POA: Diagnosis not present

## 2022-12-04 DIAGNOSIS — R918 Other nonspecific abnormal finding of lung field: Secondary | ICD-10-CM | POA: Diagnosis not present

## 2022-12-04 NOTE — Patient Instructions (Signed)
Thank you for visiting Dr. Tonia Brooms at [facility]. Today we recommend the following:  Orders Placed This Encounter  Procedures   CT Chest Wo Contrast   I will send you a message about your ct once we get it approved and completed.   Return in about 1 year (around 12/04/2023) for with APP or Dr. Tonia Brooms.    Please do your part to reduce the spread of COVID-19.

## 2023-01-11 ENCOUNTER — Ambulatory Visit (HOSPITAL_COMMUNITY)
Admission: RE | Admit: 2023-01-11 | Discharge: 2023-01-11 | Disposition: A | Discharge status: Home / Self Care | Payer: Medicaid Other | Source: Ambulatory Visit | Attending: Diagnostic Radiology | Admitting: Diagnostic Radiology

## 2023-01-11 DIAGNOSIS — R918 Other nonspecific abnormal finding of lung field: Secondary | ICD-10-CM | POA: Diagnosis present

## 2023-01-23 NOTE — Progress Notes (Signed)
Joanne Le, please let the patient know that her scan is stable. She needs a repeat CT chest in one year and a follow up appt afterwards.   Thanks,  BLI  [name], DO [facility] 01/23/2023 6:14 PM

## 2023-01-24 ENCOUNTER — Other Ambulatory Visit: Payer: Self-pay | Admitting: Emergency Medicine

## 2023-01-24 DIAGNOSIS — R918 Other nonspecific abnormal finding of lung field: Secondary | ICD-10-CM

## 2023-01-25 ENCOUNTER — Other Ambulatory Visit (HOSPITAL_COMMUNITY): Payer: Self-pay | Admitting: General Practice

## 2023-01-25 DIAGNOSIS — I700 Atherosclerosis of aorta: Secondary | ICD-10-CM

## 2023-01-29 ENCOUNTER — Ambulatory Visit (HOSPITAL_COMMUNITY)
Admission: RE | Admit: 2023-01-29 | Discharge: 2023-01-29 | Disposition: A | Discharge status: Home / Self Care | Payer: Medicaid Other | Source: Ambulatory Visit | Attending: General Practice | Admitting: General Practice

## 2023-01-29 DIAGNOSIS — I700 Atherosclerosis of aorta: Secondary | ICD-10-CM | POA: Insufficient documentation

## 2023-01-31 ENCOUNTER — Encounter: Payer: Self-pay | Admitting: General Practice

## 2023-04-08 ENCOUNTER — Encounter: Payer: Self-pay | Admitting: Cardiovascular Disease

## 2023-04-08 NOTE — Progress Notes (Signed)
Pt cancelled  This encounter was created in error - please disregard. 

## 2023-04-10 ENCOUNTER — Ambulatory Visit: Payer: Medicaid Other | Admitting: Cardiovascular Disease

## 2023-06-18 NOTE — Progress Notes (Deleted)
 Cardiology Office Note:  .   Date:  06/18/2023  ID:  Joanne Le, DOB [birth year], MRN [id number] PCP: Calla Kicks, MD  [facility] Providers Cardiologist:  None { Click to update primary MD,subspecialty MD or APP then REFRESH:1}  History of Present Illness: .   Joanne Le is a 58 y.o. Caucasian female patient with primary hypertension, hypercholesterolemia, diabetes mellitus OSA on CPAP, pulmonary nodules and history of carcinoid tumor SP right lower lobe wedge resection in 2011.  Discussed the use of AI scribe software for clinical note transcription with the patient, who gave verbal consent to proceed.  History of Present Illness          Labs    Lab Results  Component Value Date   NA 133 (L) 10/12/2022   K 3.9 10/12/2022   CO2 26 10/12/2022   GLUCOSE 148 (H) 10/12/2022   BUN 15 10/12/2022   CREATININE 0.71 10/12/2022   CALCIUM 9.4 10/12/2022   GFRNONAA >60 10/12/2022      Latest Ref Rng & Units 10/12/2022    4:39 PM 07/01/2009    3:50 AM 06/30/2009    4:15 AM  BMP  Glucose 70 - 99 mg/dL 409  811  914   BUN 6 - 20 mg/dL 15  7  9    Creatinine 0.44 - 1.00 mg/dL 7.82  9.56  2.13   Sodium 135 - 145 mmol/L 133  132  133   Potassium 3.5 - 5.1 mmol/L 3.9  3.8  3.7   Chloride 98 - 111 mmol/L 97  92  95   CO2 22 - 32 mmol/L 26  33  30   Calcium 8.9 - 10.3 mg/dL 9.4  8.3  8.2       Latest Ref Rng & Units 10/12/2022    4:39 PM 07/02/2009    4:00 AM 07/01/2009    3:50 AM  CBC  WBC 4.0 - 10.5 K/uL 10.5  11.9  14.5   Hemoglobin 12.0 - 15.0 g/dL 08.6  57.8  46.9   Hematocrit 36.0 - 46.0 % 41.6  41.0  38.3   Platelets 150 - 400 K/uL 327  320  304    External Labs:  PCP faxed labs 01/23/2023:  A1c 6.9%.  TSH normal at 3.420.  Vitamin D75.9.  Total cholesterol 173, triglycerides 119, HDL 57, LDL 95.  Serum creatinine 0.83, BUN 15, EGFR 88 mL, LFTs normal.  Potassium 4.2.  Hb 13.6/HCT 41.9, platelets 344.  ***ROS  Physical Exam:   VS:  LMP  02/20/2011    Wt Readings from Last 3 Encounters:  12/04/22 204 lb 6.4 oz (92.7 kg)  10/12/22 209 lb 7 oz (95 kg)  11/16/15 208 lb (94.3 kg)    ***Physical Exam  Studies Reviewed: .    CT chest 01/11/2023: Cardiovascular: Heart size is normal. There is no significant pericardial fluid, thickening or pericardial calcification. There is aortic atherosclerosis, as well as atherosclerosis of the great vessels of the mediastinum and the coronary arteries, including calcified atherosclerotic plaque in the left main, left anterior descending and left circumflex coronary arteries. Stable examination demonstrating postoperative changes of wedge resection in the right lower lobe with no findings to suggest locally recurrent disease or definite metastatic disease in the lungs  EKG:         ***  Medications and allergies    Allergies  Allergen Reactions   Penicillins Hives    Has patient had a PCN reaction causing immediate rash,  facial/tongue/throat swelling, SOB or lightheadedness with hypotension: No Has patient had a PCN reaction causing severe rash involving mucus membranes or skin necrosis: No Has patient had a PCN reaction that required hospitalization No Has patient had a PCN reaction occurring within the last 10 years: No If all of the above answers are "NO", then may proceed with Cephalosporin use.      Current Outpatient Medications:    amLODipine (NORVASC) 5 MG tablet, Take 5 mg by mouth daily., Disp: , Rfl:    aspirin 81 MG tablet, Take 81 mg by mouth daily., Disp: , Rfl:    atorvastatin (LIPITOR) 20 MG tablet, Take 20 mg by mouth every evening., Disp: , Rfl:    celecoxib (CELEBREX) 50 MG capsule, Take 50 mg by mouth 2 (two) times daily., Disp: , Rfl:    Cholecalciferol 50 MCG (2000 UT) TABS, Take by mouth., Disp: , Rfl:    diclofenac Sodium (VOLTAREN) 1 % GEL, Apply topically., Disp: , Rfl:    JARDIANCE 10 MG TABS tablet, Take 25 mg by mouth daily., Disp: , Rfl:     levothyroxine (SYNTHROID, LEVOTHROID) 25 MCG tablet, Take 25 mcg by mouth daily before breakfast., Disp: , Rfl:    lisinopril-hydrochlorothiazide (PRINZIDE,ZESTORETIC) 20-25 MG tablet, Take 1 tablet by mouth daily., Disp: , Rfl:    Omega-3 Fatty Acids (FISH OIL) 1200 MG CAPS, Take 1 capsule by mouth 3 (three) times daily., Disp: , Rfl:    ondansetron (ZOFRAN-ODT) 4 MG disintegrating tablet, Take 1 tablet (4 mg total) by mouth every 8 (eight) hours as needed for nausea or vomiting., Disp: 20 tablet, Rfl: 0   ASSESSMENT AND PLAN: .      ICD-10-CM   1. Aortic atherosclerosis (HCC)  I70.0     2. Primary hypertension  I10     3. Hypercholesteremia  E78.00     4. Type 2 diabetes mellitus without complication, without long-term current use of insulin (HCC)  E11.9       1. Aortic atherosclerosis (HCC) ***  2. Primary hypertension ***  3. Hypercholesteremia ***  4. Type 2 diabetes mellitus without complication, without long-term current use of insulin [facility]) ***  Assessment and Plan                 Signed,  Yates Decamp, MD, [facility] 06/18/2023, 5:59 AM [facility] [address] [place], [place] [address] Phone: [phone]. Fax:  [phone]

## 2023-06-19 ENCOUNTER — Ambulatory Visit: Payer: Medicaid Other | Admitting: Cardiology

## 2023-06-19 DIAGNOSIS — E119 Type 2 diabetes mellitus without complications: Secondary | ICD-10-CM

## 2023-06-19 DIAGNOSIS — E7800 Pure hypercholesterolemia, unspecified: Secondary | ICD-10-CM

## 2023-06-19 DIAGNOSIS — I700 Atherosclerosis of aorta: Secondary | ICD-10-CM

## 2023-06-19 DIAGNOSIS — I10 Essential (primary) hypertension: Secondary | ICD-10-CM

## 2023-08-22 ENCOUNTER — Ambulatory Visit: Payer: Medicaid Other | Admitting: Cardiology

## 2023-09-06 DIAGNOSIS — I2510 Atherosclerotic heart disease of native coronary artery without angina pectoris: Secondary | ICD-10-CM | POA: Insufficient documentation

## 2023-09-06 HISTORY — DX: Atherosclerotic heart disease of native coronary artery without angina pectoris: I25.10

## 2023-09-14 ENCOUNTER — Encounter: Payer: Self-pay | Admitting: Cardiology

## 2023-09-14 ENCOUNTER — Ambulatory Visit: Payer: Self-pay | Attending: Cardiology | Admitting: Cardiology

## 2023-09-14 VITALS — BP 110/70 | HR 69 | Ht 63.0 in | Wt 198.6 lb

## 2023-09-14 DIAGNOSIS — I700 Atherosclerosis of aorta: Secondary | ICD-10-CM | POA: Diagnosis not present

## 2023-09-14 DIAGNOSIS — I10 Essential (primary) hypertension: Secondary | ICD-10-CM

## 2023-09-14 DIAGNOSIS — E7800 Pure hypercholesterolemia, unspecified: Secondary | ICD-10-CM | POA: Diagnosis not present

## 2023-09-14 DIAGNOSIS — I447 Left bundle-branch block, unspecified: Secondary | ICD-10-CM | POA: Diagnosis not present

## 2023-09-14 DIAGNOSIS — I2510 Atherosclerotic heart disease of native coronary artery without angina pectoris: Secondary | ICD-10-CM | POA: Diagnosis not present

## 2023-09-14 MED ORDER — METOPROLOL TARTRATE 25 MG PO TABS: 25.0000 mg | ORAL_TABLET | Freq: Once | ORAL | 0 refills | Status: AC

## 2023-09-14 NOTE — Patient Instructions (Addendum)
 Medication Instructions:  No changes *If you need a refill on your cardiac medications before your next appointment, please call your pharmacy*  Lab Work: Today: bloodwork on first floor (bmet)  Testing/Procedures: Your physician has requested that you have an echocardiogram. Echocardiography is a painless test that uses sound waves to create images of your heart. It provides your doctor with information about the size and shape of your heart and how well your heart's chambers and valves are working. This procedure takes approximately one hour. There are no restrictions for this procedure. Please do NOT wear cologne, perfume, aftershave, or lotions (deodorant is allowed). Please arrive 15 minutes prior to your appointment time.  Please note: We ask at that you not bring children with you during ultrasound (echo/ vascular) testing. Due to room size and safety concerns, children are not allowed in the ultrasound rooms during exams. Our front office staff cannot provide observation of children in our lobby area while testing is being conducted. An adult accompanying a patient to their appointment will only be allowed in the ultrasound room at the discretion of the ultrasound technician under special circumstances. We apologize for any inconvenience.  Coronary CT Angiogram - see instructions below  Follow-Up: At [facility], you and your health needs are our priority.  As part of our continuing mission to provide you with exceptional heart care, our providers are all part of one team.  This team includes your primary Cardiologist (physician) and Advanced Practice Providers or APPs (Physician Assistants and Nurse Practitioners) who all work together to provide you with the care you need, when you need it.  Your next appointment:    As needed  Other Instructions   Your cardiac CT will be scheduled at one of the below locations:   [facility] [address] [place], [place] [address] [phone]   OR   [facility] [address]  [place], [place] [address] Opening September 03, 2023  If scheduled at [facility], please arrive at the [facility] and Children's Entrance (Entrance C2) of [facility] 30 minutes prior to test start time. You can use the FREE valet parking offered at entrance C (encouraged to control the heart rate for the test)  Proceed to the [facility] Radiology Department (first floor) to check-in and test prep.   All radiology patients and guests should use entrance C2 at [facility], accessed from [facility], even though the hospital's physical address listed is [address]    If scheduled at the Heart and Vascular Tower at [address], please enter the parking lot using the [address] entrance and use the FREE valet service at the patient drop-off area. Enter the buidling and check-in with registration on the main floor.   Please follow these instructions carefully (unless otherwise directed):  An IV will be required for this test and Nitroglycerin will be given.   On the Night Before the Test: Be sure to Drink plenty of water . Do not consume any caffeinated/decaffeinated beverages or chocolate 12 hours prior to your test. Do not take any antihistamines 12 hours prior to your test.  On the Day of the Test: Drink plenty of water  until 1 hour prior to the test. Do not eat any food 1 hour prior to test. You may take your regular medications prior to the test. Patients who wear a continuous glucose monitor MUST remove the device prior to scanning. FEMALES-  please wear underwire-free bra if available, avoid dresses & tight clothing      After the Test: Drink plenty of water . After receiving IV contrast, you may experience a mild flushed feeling. This is normal. On occasion, you may experience a mild rash up to 24 hours after the  test. This is not dangerous. If this occurs, you can take Benadryl  25 mg, Zyrtec, Claritin, or Allegra and increase your fluid intake. (Patients taking Tikosyn should avoid Benadryl , and may take Zyrtec, Claritin, or Allegra) If you experience trouble breathing, this can be serious. If it is severe call 911 IMMEDIATELY. If it is mild, please call our office.  We will call to schedule your test 2-4 weeks out understanding that some insurance companies will need an authorization prior to the service being performed.   For more information and frequently asked questions, please visit our website : [email or web address]  For non-scheduling related questions, please contact the cardiac imaging nurse navigator should you have any questions/concerns: Cardiac Imaging Nurse Navigators Direct Office Dial: [phone]   For scheduling needs, including cancellations and rescheduling, please call [place], [phone].

## 2023-09-14 NOTE — Addendum Note (Signed)
 Addended by: Keyshawn Hellwig on: 09/14/2023 01:24 PM   Modules accepted: Orders

## 2023-09-14 NOTE — Progress Notes (Signed)
 Cardiology CONSULT Note    Date:  09/14/2023   ID:  Joanne Le, DOB [birth year], MRN [id number]  PCP:  Cabezudo, Ignacio, MD  Cardiologist:  Gaylyn Keas, MD   Chief Complaint  Patient presents with   New Patient (Initial Visit)    Aortic atherosclerosis and coronary artery calcifications on chest CT    Patient Profile: Joanne Le is a 58 y.o. female who is being seen today for the evaluation of Aortic atherosclerosis at the request of Clemon Cypress, FNP.  History of Present Illness:  Joanne Le is a 58 y.o. female who is being seen today for the evaluation of aortic atherosclerosis at the request of Clemon Cypress, FNP.  This is a 58yo female with a hx of   carcinoid of the lung, diabetes mellitus 2, hyperlipidemia, GERD, hypertension, hypothyroidism and tobacco abuse.  She recently had a chest CT done for follow-up of her carcinoid of the lung status post right lower lobe wedge resection.  CT demonstrated aortic atherosclerosis as well as two-vessel coronary artery including the left main.  She is now referred for further evaluation.  She is here today for followup and is doing well.  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  When she goes up and down stairs she feels SOB due to having to work harder because of her bad knees.  She denies any chest pain or pressure, PND, orthopnea, LE edema, dizziness, palpitations or syncope. She has a LBBB on EKG but thinks she was told she had this in the past. She is compliant with her meds and is tolerating meds with no SE.    Past Medical History:  Diagnosis Date   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   Diabetes mellitus    type 2   Dyslipidemia    GERD (gastroesophageal reflux disease)    HTN (hypertension)    Hypothyroidism    Nodule of right lung    lower lobe   Tobacco abuse     Past Surgical History:  Procedure Laterality Date   BONE RESECTION     CESAREAN  SECTION     COLONOSCOPY N/A 11/16/2015   Procedure: COLONOSCOPY;  Surgeon: Suzette Espy, MD;  [facility];  Service: Endoscopy;  Laterality: N/A;  1030   Right VATS, mini thoracotomy, wedge right lower  06/28/2009   TUBAL LIGATION      Current Medications: Current Meds  Medication Sig   amLODipine (NORVASC) 5 MG tablet Take 5 mg by mouth daily.   aspirin 81 MG tablet Take 81 mg by mouth daily.   atorvastatin (LIPITOR) 20 MG tablet Take 20 mg by mouth every evening.   Cholecalciferol 50 MCG (2000 UT) TABS Take by mouth.   JARDIANCE 10 MG TABS tablet Take 25 mg by mouth daily.   levothyroxine (SYNTHROID, LEVOTHROID) 25 MCG tablet Take 25 mcg by mouth daily before breakfast.   lisinopril-hydrochlorothiazide (PRINZIDE,ZESTORETIC) 20-25 MG tablet Take 1 tablet by mouth daily.   Omega-3 Fatty Acids (FISH OIL) 1200 MG CAPS Take 1 capsule by mouth 3 (three) times daily.    Allergies:   Penicillins   Social History   Socioeconomic History   Marital status: Single    Spouse name: Not on file   Number of children: 2   Years of education: Not on file   Highest education level: Not  on file  Occupational History    Employer: [employer]  Tobacco Use   Smoking status: Former    Current packs/day: 0.00    Types: Cigarettes    Quit date: 03/25/2009    Years since quitting: 14.4   Smokeless tobacco: Not on file  Substance and Sexual Activity   Alcohol use: Yes    Comment: not on a reg. basis   Drug use: No   Sexual activity: Not on file  Other Topics Concern   Not on file  Social History Narrative   Not on file   Social Drivers of Health   Financial Resource Strain: Not on file  Food Insecurity: Not on file  Transportation Needs: Not on file  Physical Activity: Not on file  Stress: Not on file  Social Connections: Not on file     Family History:  The patient's family history includes Diabetes in her father and paternal grandfather; Heart attack (age of  onset: 21) in her father; Hyperlipidemia in her father, paternal grandfather, and paternal grandmother; Hypertension in her father, paternal grandfather, and paternal grandmother; Osteoarthritis in her mother; Stroke in her father and paternal grandmother; Urinary tract infection in her mother.   ROS:   Please see the history of present illness.    ROS All other systems reviewed and are negative.      No data to display             PHYSICAL EXAM:   VS:  BP 110/70   Pulse 69   Ht 5\' 3"  (1.6 m)   Wt 198 lb 9.6 oz (90.1 kg)   LMP 02/20/2011   SpO2 99%   BMI 35.18 kg/m    GEN: Well nourished, well developed, in no acute distress  HEENT: normal  Neck: no JVD, carotid bruits, or masses Cardiac: RRR; no murmurs, rubs, or gallops,no edema.  Intact distal pulses bilaterally.  Respiratory:  clear to auscultation bilaterally, normal work of breathing GI: soft, nontender, nondistended, + BS MS: no deformity or atrophy  Skin: warm and dry, no rash Neuro:  Alert and Oriented x 3, Strength and sensation are intact Psych: euthymic mood, full affect  Wt Readings from Last 3 Encounters:  09/14/23 198 lb 9.6 oz (90.1 kg)  12/04/22 204 lb 6.4 oz (92.7 kg)  10/12/22 209 lb 7 oz (95 kg)      Studies/Labs Reviewed:   EKG Interpretation Date/Time:  Friday Sep 14 2023 12:28:22 EDT Ventricular Rate:  69 PR Interval:  168 QRS Duration:  146 QT Interval:  452 QTC Calculation: 484 R Axis:   -42  Text Interpretation: Normal sinus rhythm Left axis deviation Left bundle branch block When compared with ECG of 24-Jun-2009 11:49, Left bundle branch block is now Present Confirmed by Gaylyn Keas [phone]) on 09/14/2023 1:02:47 PMEKG Interpretation Date/Time:  Friday Sep 14 2023 12:28:22 EDT Ventricular Rate:  69 PR Interval:  168 QRS Duration:  146 QT Interval:  452 QTC Calculation: 484 R Axis:   -42  Text Interpretation: Normal sinus rhythm Left axis deviation Left bundle branch block When  compared with ECG of 24-Jun-2009 11:49, Left bundle branch block is now Present Confirmed by Gaylyn Keas [phone]) on 09/14/2023 1:02:47 PM        Recent Labs: 10/12/2022: ALT 25; BUN 15; Creatinine, Ser 0.71; Hemoglobin 14.1; Platelets 327; Potassium 3.9; Sodium 133   Lipid Panel No results found for: "CHOL", "TRIG", "HDL", "CHOLHDL", "VLDL", "LDLCALC", "LDLDIRECT"    Additional studies/ records that  were reviewed today include:  Chest CT 01/26/2023    ASSESSMENT:    1. Atherosclerosis of aorta (HCC)   2. Coronary artery calcification seen on CAT scan   3. LBBB (left bundle branch block)   4. Pure hypercholesterolemia   5. Primary hypertension      PLAN:  In order of problems listed above:  #Aortic atherosclerosis - This was noted on chest CT done for follow-up of carcinoid lung cancer  #Coronary artery calcifications #Left bundle branch block - Noted to have two-vessel coronary artery calcification including left main on chest CT 01/11/2023 - Cardiac risk factors include diabetes, hypertension, hyperlipidemia and tobacco use as well as family history of CAD - EKG shows new left bundle branch block - I am going to get a coronary CTA to define coronary anatomy and rule out significant CAD - 2D echo to assess LVF  #Hyperlipidemia - LDL goal less than 70 given her coronary artery calcifications - she is to get her lipids done at PCP office in a few weeks and I will get a copy - Continue atorvastatin 20 mg daily  #Hypertension - BP controlled on exam today - Continue amlodipine 5 mg daily, Zestoretic 20-25 mg daily with as needed refills  Time Spent: 20 minutes total time of encounter, including 15 minutes spent in face-to-face patient care on the date of this encounter. This time includes coordination of care and counseling regarding above mentioned problem list. Remainder of non-face-to-face time involved reviewing chart documents/testing relevant to the patient encounter  and documentation in the medical record. I have independently reviewed documentation from referring provider  Followup:  PRN pending results of study  Medication Adjustments/Labs and Tests Ordered: Current medicines are reviewed at length with the patient today.  Concerns regarding medicines are outlined above.  Medication changes, Labs and Tests ordered today are listed in the Patient Instructions below.  There are no Patient Instructions on file for this visit.   Signed, Gaylyn Keas, MD  09/14/2023 1:14 PM    [facility] [address] [place], [place], [place]  [address] Phone: [phone]; Fax: [phone]

## 2023-09-17 ENCOUNTER — Other Ambulatory Visit: Payer: Self-pay | Admitting: Cardiology

## 2023-09-18 ENCOUNTER — Telehealth: Payer: Self-pay | Admitting: Cardiology

## 2023-09-18 NOTE — Telephone Encounter (Signed)
 Patient ask that lab request be sent to [facility]. Fax number [phone] Dr. Benita Bramble. Please advise

## 2023-09-18 NOTE — Telephone Encounter (Addendum)
 Lab order faxed   No answer tried x4

## 2023-09-18 NOTE — Telephone Encounter (Signed)
 Left voicemail for patient to return call to office.

## 2023-09-19 NOTE — Telephone Encounter (Signed)
 Correct fax number received from pts PCP office.  All papers/lab request was faxed to Benita Bramble NP's office at [phone].  Fax confirmation received.

## 2023-09-24 LAB — LAB REPORT - SCANNED
A1c: 6.4
Creatinine, POC: 87.4 mg/dL
EGFR (Non-African Amer.): 65
Free T4: 1.3 ng/dL
Microalb Creat Ratio: <3
Microalbumin, Urine: <3
TSH: 1.9 (ref 0.41–5.90)

## 2023-09-28 ENCOUNTER — Ambulatory Visit: Payer: Self-pay | Admitting: Cardiology

## 2023-09-28 DIAGNOSIS — Z79899 Other long term (current) drug therapy: Secondary | ICD-10-CM

## 2023-09-28 DIAGNOSIS — E785 Hyperlipidemia, unspecified: Secondary | ICD-10-CM

## 2023-09-28 DIAGNOSIS — I2510 Atherosclerotic heart disease of native coronary artery without angina pectoris: Secondary | ICD-10-CM

## 2023-10-02 ENCOUNTER — Telehealth: Payer: Self-pay | Admitting: Cardiology

## 2023-10-02 NOTE — Telephone Encounter (Signed)
 Patient states she is returning a call she received from the office this morning. Advised I do not see any documentation, but would send message. Also advised it was possibly an automated reminder call for upcoming CT 05/30.   Please advise.

## 2023-10-03 ENCOUNTER — Encounter (HOSPITAL_COMMUNITY): Payer: Self-pay

## 2023-10-04 ENCOUNTER — Encounter: Payer: Self-pay | Admitting: Cardiology

## 2023-10-04 ENCOUNTER — Other Ambulatory Visit: Payer: Self-pay

## 2023-10-04 ENCOUNTER — Telehealth (HOSPITAL_COMMUNITY): Payer: Self-pay | Admitting: Emergency Medicine

## 2023-10-04 ENCOUNTER — Telehealth: Payer: Self-pay

## 2023-10-04 DIAGNOSIS — E785 Hyperlipidemia, unspecified: Secondary | ICD-10-CM

## 2023-10-04 DIAGNOSIS — Z79899 Other long term (current) drug therapy: Secondary | ICD-10-CM

## 2023-10-04 MED ORDER — ATORVASTATIN CALCIUM 40 MG PO TABS: 40.0000 mg | ORAL_TABLET | Freq: Every day | ORAL | 3 refills | Status: DC

## 2023-10-04 NOTE — Telephone Encounter (Signed)
 Reaching out to patient to offer assistance regarding upcoming cardiac imaging study; pt verbalizes understanding of appt date/time, parking situation and where to check in, pre-test NPO status and medications ordered, and verified current allergies; name and call back number provided for further questions should they arise Rockwell Alexandria RN Navigator Cardiac Imaging [facility] [phone] office [phone] cell

## 2023-10-04 NOTE — Telephone Encounter (Signed)
-----   Message from Gaylyn Keas sent at 09/28/2023 11:52 AM EDT ----- LDL from PCP office is 108 with goal less than 70.  Please increase atorvastatin  to 40 mg daily repeat FLP and ALT in 6 weeks

## 2023-10-04 NOTE — Telephone Encounter (Signed)
 Patient calling in regarding Dr. Charl Concha recommendation that she increase her atorvastatin to 40 mg. Pateint states she checked her records with her pharmacy and she has been on 40 mg atorvastatin since February 2025. She had a ton of 20 mg capsules so she has been using those instead of getting a new prescription for 40 mg atorvastatin. Patient asking for clarification of what dose atorvastatin dose. Forwarded to Dr. Micael Adas.

## 2023-10-04 NOTE — Telephone Encounter (Signed)
 See telephone note from today.

## 2023-10-04 NOTE — Telephone Encounter (Signed)
 MC and phone call to patient to explain she needs to increase atorvastatin due to elevated LDL and then repeat labs. Patient verbalizes understanding. Orders placed.

## 2023-10-05 ENCOUNTER — Ambulatory Visit (HOSPITAL_COMMUNITY)
Admission: RE | Admit: 2023-10-05 | Discharge: 2023-10-05 | Disposition: A | Discharge status: Home / Self Care | Payer: Self-pay | Source: Ambulatory Visit | Attending: Cardiology | Admitting: Cardiology

## 2023-10-05 DIAGNOSIS — I447 Left bundle-branch block, unspecified: Secondary | ICD-10-CM | POA: Insufficient documentation

## 2023-10-05 DIAGNOSIS — I2510 Atherosclerotic heart disease of native coronary artery without angina pectoris: Secondary | ICD-10-CM | POA: Diagnosis present

## 2023-10-05 MED ORDER — IOHEXOL 350 MG/ML SOLN
100.0000 mL | Freq: Once | INTRAVENOUS | Status: AC | PRN
  Administered 2023-10-05: 100 mL via INTRAVENOUS

## 2023-10-05 MED ORDER — NITROGLYCERIN 0.4 MG SL SUBL
0.8000 mg | SUBLINGUAL_TABLET | Freq: Once | SUBLINGUAL | Status: AC
  Administered 2023-10-05: 0.8 mg via SUBLINGUAL

## 2023-10-05 MED ORDER — DILTIAZEM HCL 25 MG/5ML IV SOLN: 10.0000 mg | INTRAVENOUS | Status: DC | PRN

## 2023-10-05 MED ORDER — METOPROLOL TARTRATE 5 MG/5ML IV SOLN: 10.0000 mg | Freq: Once | INTRAVENOUS | Status: DC | PRN

## 2023-10-08 ENCOUNTER — Encounter: Payer: Self-pay | Admitting: Cardiology

## 2023-10-10 MED ORDER — ATORVASTATIN CALCIUM 80 MG PO TABS
80.0000 mg | ORAL_TABLET | Freq: Every day | ORAL | 3 refills | Status: DC
Start: 2023-10-10 — End: 2024-01-08

## 2023-10-10 NOTE — Telephone Encounter (Signed)
 Call to patient to verify she is taking 80 mg atorvastatin , patient verbalizes she is and that she will complete labs in a few weeks, med list updated.   Also reviewed coronary CTA results, patient verbalizes understanding Coronary CTA showed Cor Cal score of 143 with <25% ostial LM/prox and mid LAD/ostial LCx, 25-49% mid LCx.  and to continue ASA, statin.

## 2023-10-10 NOTE — Addendum Note (Signed)
 Addended by: Cherylyn Cos on: 10/10/2023 05:09 PM   Modules accepted: Orders

## 2023-10-10 NOTE — Telephone Encounter (Signed)
 See encounter 10/10/23

## 2023-10-11 ENCOUNTER — Telehealth: Payer: Self-pay

## 2023-10-11 MED ORDER — ATORVASTATIN CALCIUM 80 MG PO TABS
80.0000 mg | ORAL_TABLET | Freq: Every day | ORAL | 3 refills | Status: DC
Start: 2023-10-11 — End: 2023-10-16

## 2023-10-11 NOTE — Telephone Encounter (Signed)
 Atorvastatin  order sent to pharmacy of choice.

## 2023-10-16 ENCOUNTER — Telehealth: Payer: Self-pay

## 2023-10-16 MED ORDER — ATORVASTATIN CALCIUM 80 MG PO TABS: 80.0000 mg | ORAL_TABLET | Freq: Every day | ORAL | 3 refills | Status: DC

## 2023-10-16 NOTE — Addendum Note (Signed)
 Addended by: Cherylyn Cos on: 10/16/2023 05:09 PM   Modules accepted: Orders

## 2023-10-16 NOTE — Telephone Encounter (Signed)
Reordered atorvastatin per patient request.

## 2023-10-25 ENCOUNTER — Telehealth: Payer: Self-pay

## 2023-10-25 NOTE — Telephone Encounter (Signed)
 Called patient to inform her to schedule her follow after ct is completed in September and will cancel appointment fot tomorrow

## 2023-10-26 ENCOUNTER — Ambulatory Visit: Admitting: Acute Care

## 2023-10-26 ENCOUNTER — Ambulatory Visit (HOSPITAL_COMMUNITY)
Admission: RE | Admit: 2023-10-26 | Discharge: 2023-10-26 | Disposition: A | Discharge status: Home / Self Care | Source: Ambulatory Visit | Attending: Cardiology | Admitting: Cardiology

## 2023-10-26 DIAGNOSIS — I447 Left bundle-branch block, unspecified: Secondary | ICD-10-CM | POA: Diagnosis present

## 2023-10-26 LAB — ECHOCARDIOGRAM COMPLETE: S' Lateral: 2.81 cm

## 2023-10-26 MED ORDER — PERFLUTREN LIPID MICROSPHERE
1.0000 mL | INTRAVENOUS | Status: AC | PRN
  Administered 2023-10-26: 2 mL via INTRAVENOUS

## 2023-11-01 NOTE — Telephone Encounter (Signed)
-----   Message from Joanne Le sent at 10/28/2023  9:29 PM EDT ----- Normal LVF with increased stiffness of heart muscle called diastolic dysfunction and trivial leakiness of mitral valve ----- Message ----- From: Interface, [facility] Sent: 10/26/2023   2:16 PM EDT To: Joanne JONELLE Bihari, MD

## 2023-11-01 NOTE — Telephone Encounter (Signed)
 Call to patient to discuss echo results, no answer, left LVM asking patient to call our office.

## 2023-11-01 NOTE — Telephone Encounter (Signed)
 Call to patient to discuss Normal LVF on echo with increased stiffness of heart muscle called diastolic dysfunction and trivial leakiness of mitral valve. Patient verbalizes understanding.

## 2023-11-15 ENCOUNTER — Telehealth: Payer: Self-pay

## 2023-11-15 DIAGNOSIS — E785 Hyperlipidemia, unspecified: Secondary | ICD-10-CM

## 2023-11-15 NOTE — Telephone Encounter (Signed)
 Referral to lipid clinic placed per Dr. Shlomo, patient with increased joint  pain on higher dose of atorvastatin . MC to  patient.

## 2023-11-19 ENCOUNTER — Telehealth: Payer: Self-pay

## 2023-11-19 MED ORDER — ATORVASTATIN CALCIUM 40 MG PO TABS: 40.0000 mg | ORAL_TABLET | Freq: Every day | ORAL | 3 refills | Status: DC

## 2023-11-19 NOTE — Telephone Encounter (Signed)
 Patient reports intolerance of increased dose of crestor. Dr. Shlomo advises to go back to lower dose and refer to lipid clinic.

## 2023-11-20 LAB — LIPID PANEL
Chol/HDL Ratio: 3.3 ratio (ref 0.0–4.4)
Cholesterol, Total: 167 mg/dL (ref 100–199)
HDL: 50 mg/dL (ref >39)
LDL Chol Calc (NIH): 97 mg/dL (ref 0–99)
Triglycerides: 108 mg/dL (ref 0–149)
VLDL Cholesterol Cal: 20 mg/dL (ref 5–40)

## 2023-11-20 LAB — ALT: ALT: 26 IU/L (ref 0–32)

## 2023-11-21 ENCOUNTER — Ambulatory Visit: Payer: Self-pay | Admitting: Cardiology

## 2023-11-29 NOTE — Telephone Encounter (Signed)
-----   Message from Wilbert Bihari sent at 11/21/2023  9:01 AM EDT ----- LDL goal < 70 - she is not at goal with last LDL 97.  Increase Atorvastatin  to 40mg  daily and repeat FLP and ALT in 6 weeks ----- Message ----- From: Interface, [facility] Lab Results In Sent: 11/20/2023  11:35 PM EDT To: Wilbert JONELLE Bihari, MD

## 2023-11-29 NOTE — Telephone Encounter (Signed)
 Verified with patient she is currently on 40 mg atorvastatin . Patient responses forwarded to Dr. Shlomo.

## 2023-11-29 NOTE — Telephone Encounter (Signed)
 Call to patient to discuss what dose of statin she is on, LVM.

## 2023-12-14 NOTE — Telephone Encounter (Signed)
 Call to patient to verify atorvastatin  dosage.   Patient has been on atorvastatin  40 mg since 11/19/23. She tried a higher dose of 80 mg previous to that in June 2025, but was experiencing joint pain. (She was on 40 mg back in May 2025 prior to dose increase in June.)   Incidentally, she tells me that she actually has an appt w/ [facility] for workup for rheumatoid arthritis in December. She does have a lipid clinic appt set for 01/04/24 and asks if she should keep that appointment or wait until after her appt in December to change her cholesterol medications. Patient responses forwarded to Dr. Shlomo.

## 2023-12-14 NOTE — Telephone Encounter (Signed)
-----   Message from Wilbert Bihari sent at 11/30/2023  1:23 PM EDT ----- So is patient still on atorvastatin  a day and how long she been on this dose ----- Message ----- From: Janit Geni CROME, RN Sent: 11/29/2023   4:16 PM EDT To: Wilbert JONELLE Bihari, MD  ----- Message from Geni CROME Janit, RN sent at 11/29/2023  4:16 PM EDT -----  Looks like I got confused on 11/19/23 and was documenting atorvastatin  at first, but then started documenting crestor.  ----- Message ----- From: Bihari Wilbert JONELLE, MD Sent: 11/21/2023   9:01 AM EDT To: Geni CROME Janit, RN  LDL goal < 70 - she is not at goal with last LDL 97.  Increase Atorvastatin  to 40mg  daily and repeat FLP and ALT in 6 weeks ----- Message ----- From: Interface, [facility] Lab Results In Sent: 11/20/2023  11:35 PM EDT To: Wilbert JONELLE Bihari, MD

## 2024-01-04 ENCOUNTER — Ambulatory Visit: Attending: Cardiology | Admitting: Pharmacist

## 2024-01-04 DIAGNOSIS — E785 Hyperlipidemia, unspecified: Secondary | ICD-10-CM | POA: Diagnosis not present

## 2024-01-04 MED ORDER — ROSUVASTATIN CALCIUM 20 MG PO TABS: 20.0000 mg | ORAL_TABLET | Freq: Every day | ORAL | 3 refills | Status: AC

## 2024-01-04 NOTE — Patient Instructions (Addendum)
 STOP atorvastatin  START rosuvastatin  20mg  daily Please ask your primary to send us  your lab work  Hyperlipidemia Foods high in saturated fat tend to increase LDL (bad) cholesterol the most.  Not all fat is bad fat! Foods higher in unsaturated fat are healthy, like fish, nuts, and avocadoes. Overall, following a diet like the Mediterranean diet can help to improve your cholesterol. Hypertriglyceridemia Foods high in carbohydrates and sugar, as well as alcohol, can increase your triglycerides. If you are diabetic, poorly controlled blood sugar can also increase your triglycerides. A non-fasting state can affect the triglyceride level in your lab work. Please make sure you are fasting to improve accuracy of this lab test.  Eat more of these Eat less of these  Carbohydrates Fiber-rich whole grains: oats, whole wheat pasta or bread, quinoa, barley, oats and [place] rice Aim for  of your plate to be whole grains Men: aim for > 38 grams of fiber per day Women: aim for > 25 grams of fiber per day Refined grains: white bread, rice, or pasta, macaroni and cheese Foods with added sugar Processed foods: desserts like cake, cookies, donuts, muffins, and pastries; microwave meals, chips, [place] fries  Fruits and vegetables A variety of bright colored fruits and vegetables: spinach, broccoli, tomatoes, carrots, berries,  oranges, apples, bananas, berries, and melon Aim for  of your plate to be fruits/vegetables Canned vegetables Starchy vegetables like potatoes Canned fruit in heavy syrup  Protein Lean meat: skinless chicken or [place] Fish: salmon, trout, tuna, cod, tilapia, flounder, etc Legumes: beans, lentils, chickpeas, tofu, nuts Aim for  of your plate to be protein Red, fatty, or fried meat Processed foods: deli meat, hot dogs, burgers, pizza, fast food   Dairy, fats and oils Unsaturated fats: fish, nuts, and avocadoes  Low fat or fat free milk or yogurt Olive and canola oil Saturated fats:  butter, lard, cream, coconut oil Whole milk and other full fat dairy products like cheese Sugar-sweetened dairy products (many yogurts have added sugar)  Drinks Water : plain or sparkling Sugar free or diet drinks Unsweet tea or coffee Keep added sugar intake to  < 6 teaspoons (24 grams) Regular soda Fruit juice Alcohol  Other ways to adopt a healthy lifestyle:  Exercise:  Exercise: Aim for 150 min of moderate intensity exercise weekly for heart health, and weights twice weekly for bone health. Stay active - any steps are better than no steps!  Sleep: Aim for 7-9 hours of sleep nightly.  Weight: Know what a healthy weight is for you (roughly BMI <25) and aim to maintain this. Unfortunately, this is not the most accurate measure of healthy weight, but it is the simplest measurement to use. A more accurate measurement involves body scanning which measures lean muscle, fat tissue and bony density. We do not have this equipment at [facility].

## 2024-01-04 NOTE — Progress Notes (Signed)
 Patient ID: [name]                 DOB: Feb 05, 1966                    MRN: [id number]      HPI: Joanne Le is a 58 y.o. female patient referred to lipid clinic by Dr. Shlomo. 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   CT for lung cancer f/u incidentally demonstrated aortic atherosclerosis as well as two-vessel coronary artery including the left main. Coronary CTA done:  IMPRESSION: 1. Coronary calcium  score of 143. This was 94th percentile for age, sex, and race matched control.   2. Normal coronary origin with left dominance.   3. CAD-RADS 2. Mild non-obstructive CAD (25-49%). Consider non-atherosclerotic causes of chest pain. Consider preventive therapy and risk factor modification.  Atorvastatin  was increased from 40 to 80mg  due to LDL-C 97. Patient did not tolerate increase and dose was decreased back to 40mg .   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 Seeing rheumatoloy in Dec due to swelling and pain in her hands.  Patient presents today to lipid clinic.  She reports that her joint pain was worse on atorvastatin  80 mg.  Improved some since decreasing back to 40 mg.  She works 3 days a week delivering medications.  This is where she gets her exercise, no exercise outside of work.  Reviewed options for lowering LDL cholesterol, including trying another statin ezetimibe, PCSK-9 inhibitors.  Discussed dosing, side effects and potential decreases in LDL cholesterol.    Current Medications: Atorvastatin  40 mg daily Intolerances: atorvastatin  80mg  daily Risk Factors: DM, CAD on CT, HTN  LDL-C goal: <70 ApoB goal: Less than 80  Diet:  Trying to stay away from fried foods pizza Drink: water , 1/2 and 1/2 tea when she goes out  Exercise: walks at work  Family History:  Family History  Problem Relation Age of  Onset   Osteoarthritis Mother    Urinary tract infection Mother    Heart attack Father 101       CAD with PCI   Diabetes Father    Hypertension Father    Hyperlipidemia Father    Stroke Father    Hypertension Paternal Grandmother    Hyperlipidemia Paternal Grandmother    Stroke Paternal Grandmother    Diabetes Paternal Grandfather    Hyperlipidemia Paternal Grandfather    Hypertension Paternal Grandfather     Social History: no tobacco (quit in 2010), no ETOH  Labs: Lipid Panel     Component Value Date/Time   CHOL 167 11/20/2023 0756   TRIG 108 11/20/2023 0756   HDL 50 11/20/2023 0756   CHOLHDL 3.3 11/20/2023 0756   LDLCALC 97 11/20/2023 0756   LABVLDL 20 11/20/2023 0756    Past Medical History:  Diagnosis Date   CAD (coronary artery disease), native coronary artery 09/2023   Cor Cal score of 143 with <25% ostial LM/prox and mid LAD/ostial LCx, 25-49% mid LCx.   Cancer [facility])    Carcinoid   Carcinoid tumor of lung     right lower lobe   Diabetes mellitus    type 2   Dyslipidemia    GERD (gastroesophageal reflux disease)    HTN (hypertension)    Hypothyroidism    Nodule of right lung    lower lobe  Tobacco abuse     Current Outpatient Medications on File Prior to Visit  Medication Sig Dispense Refill   amLODipine (NORVASC) 5 MG tablet Take 5 mg by mouth daily.     aspirin 81 MG tablet Take 81 mg by mouth daily.     Cholecalciferol 50 MCG (2000 UT) TABS Take by mouth.     JARDIANCE 10 MG TABS tablet Take 25 mg by mouth daily.     levothyroxine (SYNTHROID, LEVOTHROID) 25 MCG tablet Take 25 mcg by mouth daily before breakfast.     lisinopril-hydrochlorothiazide (PRINZIDE,ZESTORETIC) 20-25 MG tablet Take 1 tablet by mouth daily.     metoprolol  tartrate (LOPRESSOR ) 25 MG tablet Take 1 tablet (25 mg total) by mouth once for 1 dose. Take 90-120 minutes prior to scan. Hold for SBP less than 110. 1 tablet 0   Omega-3 Fatty Acids (FISH OIL) 1200 MG CAPS Take 1 capsule  by mouth 3 (three) times daily.     No current facility-administered medications on file prior to visit.    Allergies  Allergen Reactions   Penicillins Hives    Has patient had a PCN reaction causing immediate rash, facial/tongue/throat swelling, SOB or lightheadedness with hypotension: No Has patient had a PCN reaction causing severe rash involving mucus membranes or skin necrosis: No Has patient had a PCN reaction that required hospitalization No Has patient had a PCN reaction occurring within the last 10 years: No If all of the above answers are NO, then may proceed with Cephalosporin use.     Assessment/Plan:  1. Hyperlipidemia -  [name]    Thank you,  Joanne Le Joanne Le, Pharm.Joanne Le, CPP [facility] [address], [place], [place] [address]  Phone: [phone]; Fax: [phone]

## 2024-01-04 NOTE — Assessment & Plan Note (Signed)
 Assessment: Patient with increased joint pains on atorvastatin  80 Improvement back on 40 mg daily but still having issues with joint pain Seeing rheumatology in [facility] referred by her orthopedic doctor Her LDL-C on atorvastatin  80 was 97 We discussed trial of another statin We also discussed other options or potential add-on options if statin is not enough including PCSK9 and ezetimibe Encouraged more exercise And encouraged her to decrease fried food intake and increase her fiber intake with vegetables fruits nuts  Plan: Stop atorvastatin  Start rosuvastatin  20 mg daily She sees her PCP at the end of September and he will recheck cholesterol labs

## 2024-01-17 ENCOUNTER — Telehealth: Payer: Self-pay | Admitting: Pulmonary Disease

## 2024-01-17 NOTE — Telephone Encounter (Signed)
 Patient needs new provider due to Dr.Icard leaving. Please order a new CT Chest Wo Contrast. Thank you!

## 2024-01-17 NOTE — Telephone Encounter (Signed)
 Dr Neda, can we order the new CT under your name as pt is seeing you on 01-22-24? Also, would we need to cancel that appt since the CT wasn't done?

## 2024-01-22 ENCOUNTER — Telehealth: Payer: Self-pay

## 2024-01-22 ENCOUNTER — Encounter: Payer: Self-pay | Admitting: Pulmonary Disease

## 2024-01-22 ENCOUNTER — Ambulatory Visit: Admitting: Pulmonary Disease

## 2024-01-22 VITALS — BP 117/71 | HR 64 | Ht 63.0 in | Wt 198.0 lb

## 2024-01-22 DIAGNOSIS — R918 Other nonspecific abnormal finding of lung field: Secondary | ICD-10-CM

## 2024-01-22 DIAGNOSIS — G4733 Obstructive sleep apnea (adult) (pediatric): Secondary | ICD-10-CM | POA: Diagnosis not present

## 2024-01-22 DIAGNOSIS — Z902 Acquired absence of lung [part of]: Secondary | ICD-10-CM

## 2024-01-22 NOTE — Patient Instructions (Addendum)
 We will contact [employer] to try and get an update on compliance  Order will be placed for CPAP supplies  Order for CT scan of the chest in place for lung nodules  Follow-up in about 6 weeks

## 2024-01-22 NOTE — Telephone Encounter (Signed)
 Spoke w/ Camelia PT will have to do another sleep study for a Cpap    Spoke w/ PT VBU.  _NFN

## 2024-01-22 NOTE — Progress Notes (Addendum)
 Joanne Le    [id number]    12-Jan-1966  Primary Care Physician:Cabezudo, Yolonda, MD  Referring Physician: Cabezudo, Ignacio, MD [address] [place],  [place] [address]  Chief complaint:   Patient with history of obstructive sleep apnea on CPAP therapy History of lung nodules   HPI:  last saw Dr. 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   aspirin 81 MG tablet Take 81 mg by mouth daily.   Cholecalciferol 50 MCG (2000 UT) TABS Take by mouth.   JARDIANCE 10 MG TABS tablet Take 25 mg by mouth daily.   levothyroxine (SYNTHROID, LEVOTHROID) 25 MCG tablet Take 25 mcg by mouth daily before breakfast.   lisinopril-hydrochlorothiazide (PRINZIDE,ZESTORETIC) 20-25 MG tablet Take 1 tablet by mouth daily.   metoprolol  tartrate (LOPRESSOR ) 25 MG tablet Take 1 tablet (25 mg total) by mouth once for 1 dose. Take 90-120 minutes prior to scan. Hold for SBP less than 110.   Omega-3 Fatty Acids (FISH OIL) 1200 MG CAPS Take 1 capsule by mouth 3 (three) times daily.   rosuvastatin  (CRESTOR ) 20 MG tablet Take 1 tablet (20 mg total) by mouth daily.   No facility-administered encounter medications on file as of 01/22/2024.    Allergies as of 01/22/2024 - Review Complete 01/22/2024  Allergen Reaction Noted   Penicillins Hives 02/28/2011    Past Medical History:  Diagnosis Date   CAD  (coronary artery disease), native coronary artery 09/2023   Cor Cal score of 143 with <25% ostial LM/prox and mid LAD/ostial LCx, 25-49% mid LCx.   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   Diabetes mellitus    type 2   Dyslipidemia    GERD (gastroesophageal reflux disease)    HTN (hypertension)    Hypothyroidism    Nodule of right lung    lower lobe   Tobacco abuse     Past Surgical History:  Procedure Laterality Date   BONE RESECTION     CESAREAN SECTION     COLONOSCOPY N/A 11/16/2015   Procedure: COLONOSCOPY;  Surgeon: Lamar CHRISTELLA Hollingshead, MD;  [facility]: [facility];  Service: Endoscopy;  Laterality: N/A;  1030   Right VATS, mini thoracotomy, wedge right lower  06/28/2009   TUBAL LIGATION      Family History  Problem Relation Age of Onset   Osteoarthritis Mother    Urinary tract infection Mother    Heart attack Father 48       CAD with PCI   Diabetes Father    Hypertension Father    Hyperlipidemia Father    Stroke Father    Hypertension Paternal Grandmother    Hyperlipidemia Paternal Grandmother    Stroke Paternal Grandmother  Diabetes Paternal Grandfather    Hyperlipidemia Paternal Grandfather    Hypertension Paternal Grandfather     Social History   Socioeconomic History   Marital status: Single    Spouse name: Not on file   Number of children: 2   Years of education: Not on file   Highest education level: Not on file  Occupational History    Employer: [employer]  Tobacco Use   Smoking status: Former    Current packs/day: 0.00    Types: Cigarettes    Quit date: 03/25/2009    Years since quitting: 14.8   Smokeless tobacco: Not on file  Substance and Sexual Activity   Alcohol use: Yes    Comment: not on a reg. basis   Drug use: No   Sexual activity: Not on file  Other Topics Concern   Not on file  Social History Narrative   Not on file   Social Drivers of Health   Financial Resource Strain: Not on file   Food Insecurity: Not on file  Transportation Needs: Not on file  Physical Activity: Not on file  Stress: Not on file  Social Connections: Not on file  Intimate Partner Violence: Not on file    Review of Systems  Respiratory:  Positive for apnea.   Psychiatric/Behavioral:  Positive for sleep disturbance.     Vitals:   01/22/24 1051  BP: 117/71  Pulse: 64  SpO2: 97%     Physical Exam Constitutional:      Appearance: She is obese.  HENT:     Head: Normocephalic.     Mouth/Throat:     Mouth: Mucous membranes are moist.  Eyes:     General: No scleral icterus. Cardiovascular:     Rate and Rhythm: Normal rate and regular rhythm.     Heart sounds: No murmur heard.    No friction rub.  Pulmonary:     Effort: No respiratory distress.     Breath sounds: No stridor. No wheezing or rhonchi.  Musculoskeletal:     Cervical back: No rigidity or tenderness.  Neurological:     Mental Status: She is alert.  Psychiatric:        Mood and Affect: Mood normal.    Data Reviewed: Sleep study 09/05/2022 showing mild obstructive sleep apnea  CT chest reviewed  Assessment/Plan: Mild obstructive sleep apnea - Wants to go back on using CPAP - Has not used CPAP in a few months - Was having issues getting supplies  DME is [employer] - Will contact [employer] - CPAP supplies order to [employer]  Order placed for CT scan of the chest to follow-up on lung nodules -Order already in place  Other health issues include hyperlipidemia Diabetes Hypertension - Controlled  Follow-up in about 6 weeks  Encouraged to call with significant concerns  Jennet Epley MD [facility] 01/22/2024, 11:05 AM  CC: Cabezudo, Ignacio, MD  Addendum: Update from Camelia is that patient needs a new sleep study to qualify for management of sleep apnea  - Will place order for home sleep test

## 2024-01-22 NOTE — Addendum Note (Signed)
 Addended by: NEDA HAMMOND A on: 01/22/2024 02:24 PM   Modules accepted: Orders

## 2024-01-24 ENCOUNTER — Other Ambulatory Visit: Payer: Self-pay | Admitting: Pulmonary Disease

## 2024-01-24 DIAGNOSIS — R918 Other nonspecific abnormal finding of lung field: Secondary | ICD-10-CM

## 2024-01-24 NOTE — Telephone Encounter (Signed)
 New order placed for CT scan without contrast

## 2024-01-30 LAB — LAB REPORT - SCANNED
A1c: 6.5
Creatinine, POC: 75.6 mg/dL
EGFR: 75
Free T4: 1.36 ng/dL
Microalb Creat Ratio: 13
Microalbumin, Urine: 9.6
TSH: 2.14 (ref 0.41–5.90)

## 2024-02-01 ENCOUNTER — Ambulatory Visit (HOSPITAL_COMMUNITY)

## 2024-02-02 ENCOUNTER — Other Ambulatory Visit (HOSPITAL_BASED_OUTPATIENT_CLINIC_OR_DEPARTMENT_OTHER)

## 2024-02-12 ENCOUNTER — Encounter: Payer: Self-pay | Admitting: Pharmacist

## 2024-02-19 ENCOUNTER — Ambulatory Visit (HOSPITAL_COMMUNITY): Admission: RE | Admit: 2024-02-19 | Source: Ambulatory Visit

## 2024-02-19 ENCOUNTER — Encounter (HOSPITAL_COMMUNITY): Payer: Self-pay

## 2024-02-26 ENCOUNTER — Telehealth: Payer: Self-pay

## 2024-02-26 NOTE — Telephone Encounter (Signed)
 Copied from CRM [id number]. Topic: Appointments - Appointment Cancel/Reschedule >> Feb 26, 2024  1:24 PM Chantha C wrote: Patient/patient representative is calling to cancel or reschedule an appointment. Refer to attachments for appointment information.  Patient needs to cancel appointment due to insurance won't cover CT chest scan because it's out of network. Patient is getting new insurance, will call back to reschedule 03/04/24 at 8:45 am with Dr. Neda. >> Feb 26, 2024  1:25 PM Leila BROCKS wrote: RICK    Appointment has been already cancel patient will call back to reschedule

## 2024-03-04 ENCOUNTER — Ambulatory Visit: Admitting: Pulmonary Disease

## 2024-04-15 NOTE — Progress Notes (Deleted)
 Office Visit Note  Patient: Joanne Le             Date of Birth: May 11, 1965           MRN: [id number]             PCP: Cabezudo, Ignacio, MD Referring: Ivor Heinz RAMAN, FNP Visit Date: 04/29/2024 Occupation: Data Unavailable  Subjective:  No chief complaint on file.   History of Present Illness: Joanne Le is a 58 y.o. female ***     Activities of Daily Living:  Patient reports morning stiffness for *** {minute/hour:19697}.   Patient {ACTIONS;DENIES/REPORTS:21021675::Denies} nocturnal pain.  Difficulty dressing/grooming: {ACTIONS;DENIES/REPORTS:21021675::Denies} Difficulty climbing stairs: {ACTIONS;DENIES/REPORTS:21021675::Denies} Difficulty getting out of chair: {ACTIONS;DENIES/REPORTS:21021675::Denies} Difficulty using hands for taps, buttons, cutlery, and/or writing: {ACTIONS;DENIES/REPORTS:21021675::Denies}  No Rheumatology ROS completed.   PMFS History:  Patient Active Problem List   Diagnosis Date Noted   CAD (coronary artery disease), native coronary artery 09/2023   Diverticulosis of colon without hemorrhage    Multiple lung nodules on CT 04/21/2014   Carcinoid tumor of lung (HCC)    Tobacco abuse    Diabetes mellitus (HCC)    HTN (hypertension)    Dyslipidemia     Past Medical History:  Diagnosis Date   CAD (coronary artery disease), native coronary artery 09/2023   Cor Cal score of 143 with <25% ostial LM/prox and mid LAD/ostial LCx, 25-49% mid LCx.   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   Diabetes mellitus    type 2   Dyslipidemia    GERD (gastroesophageal reflux disease)    HTN (hypertension)    Hypothyroidism    Nodule of right lung    lower lobe   Tobacco abuse     Family History  Problem Relation Age of Onset   Osteoarthritis Mother    Urinary tract infection Mother    Heart attack Father 72       CAD with PCI   Diabetes Father    Hypertension Father    Hyperlipidemia  Father    Stroke Father    Hypertension Paternal Grandmother    Hyperlipidemia Paternal Grandmother    Stroke Paternal Grandmother    Diabetes Paternal Grandfather    Hyperlipidemia Paternal Grandfather    Hypertension Paternal Grandfather    Past Surgical History:  Procedure Laterality Date   BONE RESECTION     CESAREAN SECTION     COLONOSCOPY N/A 11/16/2015   Procedure: COLONOSCOPY;  Surgeon: Lamar CHRISTELLA Hollingshead, MD;  [facility];  Service: Endoscopy;  Laterality: N/A;  1030   Right VATS, mini thoracotomy, wedge right lower  06/28/2009   TUBAL LIGATION     Social History   Tobacco Use   Smoking status: Former    Current packs/day: 0.00    Types: Cigarettes    Quit date: 03/25/2009    Years since quitting: 15.0  Substance Use Topics   Alcohol use: Yes    Comment: not on a reg. basis   Drug use: No   Social History   Social History Narrative   Not on file      There is no immunization history on file for this patient.   Objective: Vital Signs: LMP 02/20/2011    Physical Exam   Musculoskeletal Exam: ***  CDAI Exam: CDAI Score: -- Patient Global: --; Provider Global: -- Swollen: --; Tender: -- Joint Exam 04/29/2024   No joint exam  has been documented for this visit   There is currently no information documented on the homunculus. Go to the Rheumatology activity and complete the homunculus joint exam.  Investigation: No additional findings.  Imaging: No results found.  Recent Labs: Lab Results  Component Value Date   WBC 10.5 10/12/2022   HGB 14.1 10/12/2022   PLT 327 10/12/2022   NA 133 (L) 10/12/2022   K 3.9 10/12/2022   CL 97 (L) 10/12/2022   CO2 26 10/12/2022   GLUCOSE 148 (H) 10/12/2022   BUN 15 10/12/2022   CREATININE 0.71 10/12/2022   BILITOT 1.1 10/12/2022   ALKPHOS 93 10/12/2022   AST 18 10/12/2022   ALT 26 11/20/2023   PROT 7.2 10/12/2022   ALBUMIN 3.8 10/12/2022   CALCIUM  9.4 10/12/2022   GFRAA  07/01/2009    >60         The eGFR has been calculated using the MDRD equation. This calculation has not been validated in all clinical situations. eGFR's persistently <60 mL/min signify possible Chronic Kidney Disease.    Speciality Comments: No specialty comments available.  Procedures:  No procedures performed Allergies: Penicillins   Assessment / Plan:     Visit Diagnoses: No diagnosis found.  Orders: No orders of the defined types were placed in this encounter.  No orders of the defined types were placed in this encounter.   Face-to-face time spent with patient was *** minutes. Greater than 50% of time was spent in counseling and coordination of care.  Follow-Up Instructions: No follow-ups on file.   Maya Nash, MD  Note - This record has been created using Animal nutritionist.  Chart creation errors have been sought, but may not always  have been located. Such creation errors do not reflect on  the standard of medical care.

## 2024-04-29 ENCOUNTER — Encounter: Admitting: Rheumatology

## 2024-04-29 DIAGNOSIS — I10 Essential (primary) hypertension: Secondary | ICD-10-CM

## 2024-04-29 DIAGNOSIS — M79641 Pain in right hand: Secondary | ICD-10-CM

## 2024-04-29 DIAGNOSIS — G4733 Obstructive sleep apnea (adult) (pediatric): Secondary | ICD-10-CM

## 2024-04-29 DIAGNOSIS — E785 Hyperlipidemia, unspecified: Secondary | ICD-10-CM

## 2024-04-29 DIAGNOSIS — Z720 Tobacco use: Secondary | ICD-10-CM

## 2024-04-29 DIAGNOSIS — R918 Other nonspecific abnormal finding of lung field: Secondary | ICD-10-CM

## 2024-04-29 DIAGNOSIS — K5730 Diverticulosis of large intestine without perforation or abscess without bleeding: Secondary | ICD-10-CM

## 2024-04-29 DIAGNOSIS — I2510 Atherosclerotic heart disease of native coronary artery without angina pectoris: Secondary | ICD-10-CM

## 2024-04-29 DIAGNOSIS — I700 Atherosclerosis of aorta: Secondary | ICD-10-CM

## 2024-05-21 ENCOUNTER — Other Ambulatory Visit (HOSPITAL_COMMUNITY): Payer: Self-pay | Admitting: General Practice

## 2024-05-21 DIAGNOSIS — Z1231 Encounter for screening mammogram for malignant neoplasm of breast: Secondary | ICD-10-CM

## 2024-05-27 ENCOUNTER — Telehealth: Payer: Self-pay

## 2024-05-27 NOTE — Telephone Encounter (Signed)
 Copied from CRM [id number]. Topic: Appointments - Scheduling Inquiry for Clinic >> May 26, 2024 12:43 PM Dedra B wrote: Reason for CRM: Patient called regarding scheduling her CT scan. She wants to have it at [facility] but was told they don't accept her insurance. Patient said she also gets assistance through [facility] and wants to know if they take that. Please call patient.    Called and advised pt that I would forward her message to our PCCs as they handle scheduling CT scan. Pt verbalized understanding. PCCs please advise.

## 2024-05-28 NOTE — Telephone Encounter (Signed)
 CT is authorized and pt was called and given number to facility to call to schedule. I also gave my direct line if any issues may arise.

## 2024-06-06 ENCOUNTER — Ambulatory Visit: Admitting: Rheumatology

## 2024-06-06 ENCOUNTER — Ambulatory Visit (HOSPITAL_COMMUNITY)

## 2024-06-13 ENCOUNTER — Inpatient Hospital Stay: Admission: RE | Admit: 2024-06-13 | Payer: Self-pay | Source: Ambulatory Visit

## 2024-06-13 ENCOUNTER — Other Ambulatory Visit (HOSPITAL_COMMUNITY): Payer: Self-pay | Admitting: General Practice

## 2024-06-13 ENCOUNTER — Inpatient Hospital Stay (HOSPITAL_COMMUNITY): Admission: RE | Admit: 2024-06-13

## 2024-06-13 ENCOUNTER — Encounter (HOSPITAL_COMMUNITY): Payer: Self-pay

## 2024-06-13 DIAGNOSIS — Z1231 Encounter for screening mammogram for malignant neoplasm of breast: Secondary | ICD-10-CM

## 2024-06-22 ENCOUNTER — Ambulatory Visit (HOSPITAL_COMMUNITY)

## 2024-08-15 ENCOUNTER — Ambulatory Visit: Payer: Self-pay | Admitting: Rheumatology

## 2024-09-19 ENCOUNTER — Ambulatory Visit: Payer: Self-pay | Admitting: Rheumatology
